# Patient Record
Sex: Male | Born: 1990 | Race: Black or African American | Hispanic: No | Marital: Single | Smoking: Current every day smoker
Health system: Southern US, Community
[De-identification: ages and names within clinical notes are randomized; demographics above are authoritative.]

---
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
## 2011-10-09 ENCOUNTER — Emergency Department (INDEPENDENT_AMBULATORY_CARE_PROVIDER_SITE_OTHER)
Admission: EM | Admit: 2011-10-09 | Discharge: 2011-10-09 | Disposition: A | Discharge status: Home / Self Care | Payer: Federal, State, Local not specified - PPO | Source: Home / Self Care | Attending: Emergency Medicine | Admitting: Emergency Medicine

## 2011-10-09 ENCOUNTER — Encounter (HOSPITAL_COMMUNITY): Payer: Self-pay

## 2011-10-09 DIAGNOSIS — Z202 Contact with and (suspected) exposure to infections with a predominantly sexual mode of transmission: Secondary | ICD-10-CM

## 2011-10-09 MED ORDER — AZITHROMYCIN 250 MG PO TABS
ORAL_TABLET | ORAL | Status: AC
  Filled 2011-10-09: qty 4

## 2011-10-09 MED ORDER — AZITHROMYCIN 250 MG PO TABS
1000.0000 mg | ORAL_TABLET | Freq: Once | ORAL | Status: AC
  Administered 2011-10-09: 1000 mg via ORAL

## 2011-10-09 NOTE — ED Notes (Signed)
Please call pt at [phone]; number listed in snapshot at time of d/c in not correct

## 2011-10-09 NOTE — ED Notes (Signed)
Was reportedly informed by his singular partner, that he needs treatment for chlamydia; denies ANY symptoms

## 2011-10-09 NOTE — Discharge Instructions (Signed)
We will; contact you if any abnormal test results- °

## 2011-10-09 NOTE — ED Provider Notes (Signed)
History     CSN: [id number]  Arrival date & time 10/09/11  1202   First MD Initiated Contact with Patient 10/09/11 1305      Chief Complaint  Patient presents with  . Exposure to STD    (Consider location/radiation/quality/duration/timing/severity/associated sxs/prior treatment) HPI Comments: Shouldn't presents urgent care describing that he has had sexual intercourse with a male partner that was recently diagnosed with Chlamydia. He wants to be treated and checked he denies having any symptoms.  Patient is a 21 y.o. male presenting with STD exposure. The history is provided by the patient.  Exposure to STD This is a new problem. Pertinent negatives include no abdominal pain.    History reviewed. No pertinent past medical history.  History reviewed. No pertinent past surgical history.  History reviewed. No pertinent family history.  History  Substance Use Topics  . Smoking status: Not on file  . Smokeless tobacco: Not on file  . Alcohol Use: No      Review of Systems  Constitutional: Negative for activity change.  HENT: Negative for neck pain.   Eyes: Negative for itching.  Gastrointestinal: Negative for abdominal pain.  Genitourinary: Negative for dysuria, frequency, penile swelling and penile pain.    Allergies  Review of patient's allergies indicates no known allergies.  Home Medications  No current outpatient prescriptions on file.  BP 156/76  Pulse 51  Temp(Src) 98.6 F (37 C) (Oral)  Resp 16  SpO2 100%  Physical Exam  Nursing note and vitals reviewed. Constitutional: He appears well-developed and well-nourished. No distress.  Genitourinary: Penis normal. Guaiac negative stool. No phimosis, paraphimosis, penile erythema or penile tenderness. No discharge found.  Skin: Skin is warm. No erythema.    ED Course  Procedures (including critical care time)   Labs Reviewed  GC/CHLAMYDIA PROBE AMP, GENITAL   No results found.   1. Chlamydia  contact       MDM  Chlamydia exposure        Jimmie Molly, MD 10/09/11 1344

## 2011-10-10 LAB — GC/CHLAMYDIA PROBE AMP, GENITAL: GC Probe Amp, Genital: NEGATIVE

## 2014-02-05 ENCOUNTER — Emergency Department (HOSPITAL_COMMUNITY)
Admission: EM | Admit: 2014-02-05 | Discharge: 2014-02-05 | Disposition: A | Discharge status: Home / Self Care | Payer: Federal, State, Local not specified - PPO | Attending: Emergency Medicine | Admitting: Emergency Medicine

## 2014-02-05 ENCOUNTER — Encounter (HOSPITAL_COMMUNITY): Payer: Self-pay | Admitting: Emergency Medicine

## 2014-02-05 DIAGNOSIS — R599 Enlarged lymph nodes, unspecified: Secondary | ICD-10-CM | POA: Insufficient documentation

## 2014-02-05 DIAGNOSIS — J029 Acute pharyngitis, unspecified: Secondary | ICD-10-CM | POA: Insufficient documentation

## 2014-02-05 DIAGNOSIS — R197 Diarrhea, unspecified: Secondary | ICD-10-CM | POA: Insufficient documentation

## 2014-02-05 LAB — RAPID STREP SCREEN (MED CTR MEBANE ONLY): Streptococcus, Group A Screen (Direct): NEGATIVE

## 2014-02-05 MED ORDER — ACETAMINOPHEN 500 MG PO TABS: 500.0000 mg | ORAL_TABLET | Freq: Four times a day (QID) | ORAL | Status: DC | PRN

## 2014-02-05 MED ORDER — ACETAMINOPHEN 325 MG PO TABS
650.0000 mg | ORAL_TABLET | Freq: Four times a day (QID) | ORAL | Status: DC | PRN
  Administered 2014-02-05: 650 mg via ORAL
  Filled 2014-02-05: qty 2

## 2014-02-05 MED ORDER — KETOROLAC TROMETHAMINE 30 MG/ML IJ SOLN
60.0000 mg | Freq: Once | INTRAMUSCULAR | Status: AC
  Administered 2014-02-05: 60 mg via INTRAMUSCULAR
  Filled 2014-02-05: qty 2

## 2014-02-05 MED ORDER — OXYCODONE-ACETAMINOPHEN 5-325 MG PO TABS: 1.0000 | ORAL_TABLET | Freq: Four times a day (QID) | ORAL | Status: DC | PRN

## 2014-02-05 MED ORDER — IBUPROFEN 600 MG PO TABS: 600.0000 mg | ORAL_TABLET | Freq: Four times a day (QID) | ORAL | Status: DC | PRN

## 2014-02-05 MED ORDER — DEXAMETHASONE SODIUM PHOSPHATE 10 MG/ML IJ SOLN
10.0000 mg | Freq: Once | INTRAMUSCULAR | Status: AC
  Administered 2014-02-05: 10 mg via INTRAMUSCULAR
  Filled 2014-02-05: qty 1

## 2014-02-05 MED ORDER — PREDNISONE 20 MG PO TABS: ORAL_TABLET | ORAL | Status: DC

## 2014-02-05 MED ORDER — MAGIC MOUTHWASH W/LIDOCAINE: 5.0000 mL | Freq: Three times a day (TID) | ORAL | Status: DC

## 2014-02-05 NOTE — ED Notes (Signed)
Pt reports for the past 2 days that he has had a sore throat, fever, and chills.

## 2014-02-05 NOTE — Discharge Instructions (Signed)
Continue to stay well-hydrated. Gargle warm salt water and spit it out. Continue to alternate between Tylenol and Ibuprofen for pain or fever. Use prednisone as directed for your throat swelling. Use magic mouthwash as directed for throat pain. Followup with your primary care doctor in 5-7 days for recheck of ongoing symptoms or return to emergency department for emergent changing or worsening of symptoms.    Pharyngitis Pharyngitis is redness, pain, and swelling (inflammation) of your pharynx.  CAUSES  Pharyngitis is usually caused by infection. Most of the time, these infections are from viruses (viral) and are part of a cold. However, sometimes pharyngitis is caused by bacteria (bacterial). Pharyngitis can also be caused by allergies. Viral pharyngitis may be spread from person to person by coughing, sneezing, and personal items or utensils (cups, forks, spoons, toothbrushes). Bacterial pharyngitis may be spread from person to person by more intimate contact, such as kissing.  SIGNS AND SYMPTOMS  Symptoms of pharyngitis include:   Sore throat.   Tiredness (fatigue).   Low-grade fever.   Headache.  Joint pain and muscle aches.  Skin rashes.  Swollen lymph nodes.  Plaque-like film on throat or tonsils (often seen with bacterial pharyngitis). DIAGNOSIS  Your health care provider will ask you questions about your illness and your symptoms. Your medical history, along with a physical exam, is often all that is needed to diagnose pharyngitis. Sometimes, a rapid strep test is done. Other lab tests may also be done, depending on the suspected cause.  TREATMENT  Viral pharyngitis will usually get better in 3-4 days without the use of medicine. Bacterial pharyngitis is treated with medicines that kill germs (antibiotics).  HOME CARE INSTRUCTIONS   Drink enough water and fluids to keep your urine clear or pale yellow.   Only take over-the-counter or prescription medicines as directed by  your health care provider:   If you are prescribed antibiotics, make sure you finish them even if you start to feel better.   Do not take aspirin.   Get lots of rest.   Gargle with 8 oz of salt water ( tsp of salt per 1 qt of water) as often as every 1-2 hours to soothe your throat.   Throat lozenges (if you are not at risk for choking) or sprays may be used to soothe your throat. SEEK MEDICAL CARE IF:   You have large, tender lumps in your neck.  You have a rash.  You cough up green, yellow-[place], or bloody spit. SEEK IMMEDIATE MEDICAL CARE IF:   Your neck becomes stiff.  You drool or are unable to swallow liquids.  You vomit or are unable to keep medicines or liquids down.  You have severe pain that does not go away with the use of recommended medicines.  You have trouble breathing (not caused by a stuffy nose). MAKE SURE YOU:   Understand these instructions.  Will watch your condition.  Will get help right away if you are not doing well or get worse. Document Released: 07/06/2005 Document Revised: 04/26/2013 Document Reviewed: 03/13/2013 [facility] Patient Information 2015 [place], [place]. This information is not intended to replace advice given to you by your health care provider. Make sure you discuss any questions you have with your health care provider.  Salt Water Gargle This solution will help make your mouth and throat feel better. HOME CARE INSTRUCTIONS   Mix 1 teaspoon of salt in 8 ounces of warm water.  Gargle with this solution as much or often as you  need or as directed. Swish and gargle gently if you have any sores or wounds in your mouth.  Do not swallow this mixture. Document Released: 04/09/2004 Document Revised: 09/28/2011 Document Reviewed: 08/31/2008 [facility] Patient Information 2015 [place], [place]. This information is not intended to replace advice given to you by your health care provider. Make sure you discuss any questions you have with  your health care provider.  Emergency Department Resource Guide 1) Find a Doctor and Pay Out of Pocket Although you won't have to find out who is covered by your insurance plan, it is a good idea to ask around and get recommendations. You will then need to call the office and see if the doctor you have chosen will accept you as a new patient and what types of options they offer for patients who are self-pay. Some doctors offer discounts or will set up payment plans for their patients who do not have insurance, but you will need to ask so you aren't surprised when you get to your appointment.  2) Contact Your Local Health Department Not all health departments have doctors that can see patients for sick visits, but many do, so it is worth a call to see if yours does. If you don't know where your local health department is, you can check in your phone book. The CDC also has a tool to help you locate your state's health department, and many state websites also have listings of all of their local health departments.  3) Find a Walk-in Clinic If your illness is not likely to be very severe or complicated, you may want to try a walk in clinic. These are popping up all over the country in pharmacies, drugstores, and shopping centers. They're usually staffed by nurse practitioners or physician assistants that have been trained to treat common illnesses and complaints. They're usually fairly quick and inexpensive. However, if you have serious medical issues or chronic medical problems, these are probably not your best option.  No Primary Care Doctor: - Call Health Connect at  [phone] - they can help you locate a primary care doctor that  accepts your insurance, provides certain services, etc. - Physician Referral Service- [phone]  Chronic Pain Problems: Organization         Address  Phone   Notes  [facility]  [phone] Patients need to be referred by their primary care  doctor.   Medication Assistance: Organization         Address  Phone   Notes  [facility] [address] [place], [place] [id number] [phone] --Must be a resident of [place] [facility] [facility] [place] -- Must have NO insurance coverage whatsoever (no Medicaid/ Medicare, etc.) -- The pt. MUST have a primary care doctor that directs their care regularly and follows them in the community   MedAssist  [phone]   [employer]  [phone]    Agencies that provide inexpensive medical care: Organization         Address  Phone   Notes  [facility]  [phone]   [facility]    [phone]   [facility] [address] [place], [place] [address] [phone]   [facility] [address], [place] [phone]   [facility]anned Parenthood    [phone]   [facility]    [phone]   [facility]  [address], [place] Phone:  [phone], Fax:  [phone] Hours of Operation:  9 am - 6 pm, M-F.  Also accepts Medicaid/Medicare and self-pay.  [facility]  [address], [place] Phone: [phone], Fax: [phone]. Hours of Operation:  8:30 am - 5:30 pm, M-F.  Also accepts Medicaid and self-pay.  [facility] [place] [address], [place] Phone: [phone]   [facility] [address] Natasha Bence [place], [place] [phone], Ext. [phone] Mondays & Thursdays: 7-9 AM.  First 15 patients are seen on a first come, first serve basis.    Medicaid-accepting [facility] Providers:  Organization         Address  Phone   Notes  [facility] [address], [place] [phone] Also accepts self-pay patients.  [facility] [place] [address] Laurell Josephs [place], [place]  [phone]   [facility] [address], [place] [phone]   [facility] [address] [place] [phone]   Renaye Rakers [address], [place]   [phone] Only accepts [place] Access [place] patients after they have their name applied to their card.   Self-Pay (no insurance) in [facility]:  Organization         Address  Phone   Notes  Sickle Cell Patients, [facility] [address] [place], [place] [phone]   [facility] [address] [place], [place] [phone]   [facility]  [address], [place] [phone]   [facility]/Dr. Osei-Bonsu  [address] [place], [place] or [address], [place] [phone] Phone number for both [place] and [place] locations is the same.  [facility]gent Medical and [facility] [address] [place] [phone]   [facility] [address], [place] or [address] [phone]   [facility] [address], [place] [phone], phone; [phone], fax Sees patients 1st and 3rd Saturday of every month.  Must not qualify for public or private insurance (i.e. Medicaid, Medicare, [place] Health Choice, Veterans' Benefits)  Household income should be no more than 200% of the poverty level The clinic cannot treat you if you are pregnant or think you are pregnant  Sexually transmitted diseases are not treated at the clinic.    Dental Care: Organization         Address  Phone  Notes  [facility] [address] [place], [place] [phone] Accepts children up to age 17 who are enrolled in [place] or [place] Health Choice; pregnant women with a Medicaid card; and children who have applied for Medicaid or [place] Health Choice, but were declined, whose parents can pay a reduced fee at time of  service.  [facility] Department of [facility]  [address], [place] [phone] Accepts children up to age 5 who are enrolled in [place] or [place] Health Choice; pregnant women with a Medicaid card; and children who have applied for Medicaid or [place] Health Choice, but were declined, whose parents can pay a reduced fee at time of service.  [facility] Adult Dental Access PROGRAM  [address] [place], [place] [phone]  Patients are seen by appointment only. Walk-ins are not accepted. [facility] will see patients 25 years of age and older. Monday - Tuesday (8am-5pm) Most Wednesdays (8:30-5pm) $30 per visit, cash only  [facility] Adult Dental Access PROGRAM  [address], [facility] [phone] Patients are seen by appointment only. Walk-ins are not accepted. [facility] will see patients 2 years of age and older. One Wednesday Evening (Monthly: Volunteer Based).  $30 per visit, cash only  [employer]  [phone] for adults; Children under age 44, call Graduate Pediatric Dentistry at [phone]. Children aged 69-14, please call [phone] to request a pediatric application.  Dental services are provided in all areas of dental care including fillings, crowns and bridges, complete and partial dentures, implants, gum treatment, root canals, and extractions. Preventive care is also provided. Treatment is provided to both adults and children. Patients are selected via a lottery and there is often a waiting list.   [facility] [address], [place]  [phone] [email or web address]   [facility] [address] [place], [place] [phone], Ext. [phone] Second and Fourth Thursday of each month, opens at 6:30 AM; Clinic ends at 9 AM.  Patients are seen on a first-come first-served basis, and a limited number are seen during each clinic.   [facility]  [address] Ether Griffins [place],  [place] [phone]   Eligibility Requirements You must have lived in [place], [place], or [place] counties for at least the last three months.   You cannot be eligible for state or federal sponsored [place], including [employer], [place], or [employer].   You generally cannot be eligible for healthcare insurance through your employer.    How to apply: Eligibility screenings are held every Tuesday and Wednesday afternoon from 1:00 pm until 4:00 pm. You do not need an appointment for the interview!  [facility] [address] Kaliel Bolds, [place], [place] [phone]   [facility]  [phone]   [facility]  [phone]   [facility] Health Department  [phone]    Behavioral Health Resources in the Community: Intensive Outpatient Programs Organization         Address  Phone  Notes  [facility] [address] [place], [place] [phone]   [facility] Outpatient [address] [place], [place] [phone]   [facility] Drug S[facility] [address], [place], [place]  [phone]   [facility] Mental Health [address],  [place], [place] [phone] or [phone]   Substance Abuse Resources Organization         Address  Phone  Notes  Alcohol and Drug Services  [phone]   [facility]  [phone]   [facility]  [phone]   Floydene Flock  [phone]   Residential & Outpatient Substance Abuse Program  [phone]   Psychological Services Organization         Address  Phone  Notes  [facility]  [phone]   [facility]  [phone]   [facility] [address], [place] [phone] or [phone]    Mobile Crisis Teams Organization         Address  Phone  Notes  [facility], Mobile Crisis Care Unit  [phone]   [facility]  [address] [place], [place] [phone]   [facility] [address] [place]  [place] [phone]    Self-Help/Support Groups Organization         Address  Phone             Notes  [facility] [place] - variety of support groups  [place] Call for more information  Narcotics Anonymous (NA), [facility] [address], [employer] [place]  2 meetings at this location   Special educational needs teacher         Address  Phone  Notes  [facility] [place],    [place]  [phone]   [facility]  [address] [place] [id number], [place], [place]   [place], [place] [phone] Admissions: 8am-3pm M-F  Incentives Substance [place] [address]    [place], [place] [phone]   [facility] [address] [place], [place], [place]   [facility] [address]  [place], [place]   [facility] - Intensive Outpatient [address], [place], [place]   [facility] [address]  [place], [place] [phone] or [phone]   [facility] (RT[facility]) [address], [place], [place] Accepts Medicaid  [facility] [address],  [place] [phone] Substance Abuse/Addiction Treatment   [facility] Organization         Address  Phone  Notes  [facility]  [phone]   Domenic Schwab, PhD [address] Arlis Porta [place], [place]   [phone] or [phone]   [facility] [place], [place] [phone]   [facility] [address], [place], [place] [phone] Insurance/Medicaid/sponsorship through [facility] and Families [address]                                    [place], [place] [phone]  [place] [address], [place] [phone]    Dr. Adele Schilder  [phone]   [facility] Dept. 1) [address] [place] 2) [place] 3)  [address], [place] [phone]  [phone]   [place] [phone] or [phone] (After Hours)

## 2014-02-05 NOTE — ED Provider Notes (Signed)
CSN: [id number]     Arrival date & time 02/05/14  4098 History  This chart was scribed for non-physician practitioner, Allen Derry, PA-C working with Dagmar Hait, MD by Luisa Dago, ED scribe. This patient was seen in room TR09C/TR09C and the patient's care was started at 7:39 PM.    Chief Complaint  Patient presents with  . Sore Throat   Patient is a 23 y.o. male presenting with pharyngitis. The history is provided by the patient. No language interpreter was used.  Sore Throat This is a new problem. The current episode started 2 days ago. The problem occurs constantly. The problem has been gradually worsening. Pertinent negatives include no chest pain, no abdominal pain, no headaches and no shortness of breath. The symptoms are aggravated by swallowing. Nothing relieves the symptoms. Treatments tried: nyquil. The treatment provided no relief.   HPI Comments: DEMTRIUS Cameron is a 23 y.o. male with no PMHx who presents to the Emergency Department complaining of worsening sore throat that started approximately 2 days ago. He is also complaining of associated fever, diarrhea, chills. Pt rates his current pain as a "10/10", in his throat, non-radiating, constant, worse with swallowing, and with no known alleviating factors. Reports taking Nyquil with no relief. He states that he is having difficulty swallowing secondary to the pain, and has a muffled voice. 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 Denies any sick contacts, medicinal allergies, environmental allergies, HA, abd pain, nausea, emesis, congestion, cough, leg swelling, chest pain, SOB, ear pain, history of sore throat, or pertinent medical history.  History reviewed. No pertinent past medical history. History reviewed. No pertinent past surgical history. History reviewed. No pertinent family history. History  Substance Use Topics  .  Smoking status: Never Smoker   . Smokeless tobacco: Not on file  . Alcohol Use: Yes    Review of Systems  Constitutional: Positive for fever and chills.  HENT: Positive for sore throat and voice change (muffled). Negative for congestion, dental problem, drooling, ear discharge, ear pain, facial swelling, postnasal drip, rhinorrhea, sinus pressure, tinnitus and trouble swallowing.   Eyes: Negative for pain and redness.  Respiratory: Negative for cough and shortness of breath.   Cardiovascular: Negative for chest pain.  Gastrointestinal: Positive for diarrhea. Negative for nausea, vomiting, abdominal pain and blood in stool.  Musculoskeletal: Negative for arthralgias, joint swelling and myalgias.  Skin: Negative for rash.  Neurological: Negative for weakness and headaches.      Allergies  Review of patient's allergies indicates no known allergies.  Home Medications   Prior to Admission medications   Medication Sig Start Date End Date Taking? Authorizing Provider  Pseudoeph-Doxylamine-DM-APAP (NYQUIL PO) Take 30 mLs by mouth daily as needed (for cough/congestion).   Yes Historical Provider, MD  acetaminophen (TYLENOL) 500 MG tablet Take 1 tablet (500 mg total) by mouth every 6 (six) hours as needed for mild pain or fever. 02/05/14   Rozina Pointer Strupp Camprubi-Soms, PA-C  Alum & Mag Hydroxide-Simeth (MAGIC MOUTHWASH W/LIDOCAINE) SOLN Take 5 mLs by mouth 3 (three) times daily. Swish and swallow 02/05/14   Eavan Gonterman Strupp Camprubi-[employer]oms, PA-C  ibuprofen (ADVIL,MOTRIN) 600 MG tablet Take 1 tablet (600 mg total) by mouth every 6 (six) hours as needed for fever or mild pain. 02/05/14   Keni Elison Strupp Camprubi-Soms, PA-C  oxyCODONE-acetaminophen (PERCOCET) 5-325 MG per tablet Take 1-2 tablets by mouth every 6 (six) hours as needed  for severe pain. 02/05/14   Clodagh Odenthal Strupp Camprubi-Soms, PA-C  predniSONE (DELTASONE) 20 MG tablet 3 tabs po daily x 4 days 02/05/14   Karas Pickerill Strupp Camprubi-[employer]oms, PA-C   BP 148/71  Pulse 93  Temp(Src) 102.9 F (39.4 C) (Axillary)  Resp 18  Ht 5\' 10"  (1.778 m)  Wt 165 lb (74.844 kg)  BMI 23.68 kg/m2  SpO2 99%   Physical Exam  Nursing note and vitals reviewed. Constitutional: He is oriented to person, place, and time. He appears well-developed and well-nourished.  Non-toxic appearance. No distress.  Febrile, nontoxic appearing  HENT:  Head: Normocephalic and atraumatic.  Right Ear: Hearing, tympanic membrane, external ear and ear canal normal. No drainage or swelling. No mastoid tenderness. Tympanic membrane is not injected.  Left Ear: Hearing, tympanic membrane, external ear and ear canal normal. No drainage or swelling. No mastoid tenderness. Tympanic membrane is not injected.  Nose: Nose normal. No mucosal edema or rhinorrhea.  Mouth/Throat: Uvula is midline and mucous membranes are normal. No trismus in the jaw. No uvula swelling. Oropharyngeal exudate, posterior oropharyngeal edema and posterior oropharyngeal erythema present. No tonsillar abscesses.  Dixon/AT, bilateral ears free of drainage or erythema. Nose clear with no rhinorrhea or edema. 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 Using a tongue depressor, I was able to visualize both tonsils which are covered with a white exudate, with 4+ swelling on both sides equally, and erythematous. Swab was obtained. There is no peritonsillar abscess visualized. No trismus. Uvula is midline with no swelling. Mucous membranes are moist  Eyes: Conjunctivae and EOM are normal. Pupils are equal, round, and reactive to light.  Neck: Normal range of motion. Neck supple.  Cardiovascular: Normal rate and intact distal pulses.   Pulmonary/Chest: Effort normal and breath sounds normal. No respiratory distress. He has no decreased breath sounds. He has no wheezes. He has no rhonchi. He has no rales.  Abdominal: Normal appearance. He  exhibits no distension.  Musculoskeletal: Normal range of motion.  Lymphadenopathy:       Head (right side): Tonsillar adenopathy present. No submental, no submandibular and no occipital adenopathy present.       Head (left side): Tonsillar adenopathy present. No submental, no submandibular and no occipital adenopathy present.    He has cervical adenopathy.       Right cervical: Superficial cervical and deep cervical adenopathy present. No posterior cervical adenopathy present.      Left cervical: Superficial cervical and deep cervical adenopathy present. No posterior cervical adenopathy present.  Tonsillar and superficial and deep cervical lymphadenopathy, nontender to palpation.  Neurological: He is alert and oriented to person, place, and time.  Skin: Skin is warm, dry and intact. No rash noted.  No rash  Psychiatric: He has a normal mood and affect. His behavior is normal.    ED Course  Procedures (including critical care time)  DIAGNOSTIC STUDIES: Oxygen Saturation is 99% on RA, normal by my interpretation.    COORDINATION OF CARE: 7:44 PM- Advised pt to alternate between tylenol and Ibuprofen for his fever. Will prescribe a short course of Prednisone. Pt advised of plan for treatment and pt agrees.  Labs Review Labs Reviewed  RAPID STREP SCREEN  CULTURE, GROUP A STREP    Imaging Review No results found.   EKG Interpretation None      MDM   Final diagnoses:  Pharyngitis    Forest GleasonJared R Sherburne is  a 23 y.o. male presenting with sore throat x2 days along with fever. Upon exam, tonsils are erythematous, edematous, and covered with exudate. Original fever was obtained orally but pt was sipping on soda, therefore axillary temp was obtained and was 102.9. 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 There is no trismus, no drooling, no  evidence of a peritonsillar abscess. Tylenol and Toradol were given in the ER to help decrease his fever, as well as a Decadron shot for swelling. Awaiting re-swab results. Do not feel pt needs imaging at this time, will monitor.  9:03 PM Re-swab was negative for strep. Will wait for culture, discussed with the patient that if the culture returns positive then he will be called and given a medication to take. At this point, his pain has improved, and his phonation has improved immensely after decadron shot. His tonsils are less swollen at reexamination, again there is no peritonsillar abscess noted. I feel comfortable discharging this patient with strict return precautions. Have given him prednisone for 4 days for continued swelling relief, Tylenol and ibuprofen for pain and fever, Magic mouthwash to help with pain, as well as a small supply of Percocet for severe pain. Discussed salt water gargles. Given strict return precautions. Pt stable for d/c, and fever decreased to 100.4 prior to discharge.   I personally performed the services described in this documentation, which was scribed in my presence. The recorded information has been reviewed and is accurate.  BP 95/44  Pulse 80  Temp(Src) 100.4 F (38 C) (Oral)  Resp 18  Ht 5\' 10"  (1.778 m)  Wt 165 lb (74.844 kg)  BMI 23.68 kg/m2  SpO2 100%    [employer], PA-C 02/06/14 0345

## 2014-02-06 NOTE — ED Provider Notes (Signed)
Medical screening examination/treatment/procedure(s) were performed by non-physician practitioner and as supervising physician I was immediately available for consultation/collaboration.   EKG Interpretation None        William Trejuan Matherne, MD 02/06/14 2246 

## 2014-02-07 LAB — CULTURE, GROUP A STREP

## 2014-02-24 ENCOUNTER — Encounter (HOSPITAL_COMMUNITY): Payer: Self-pay | Admitting: Emergency Medicine

## 2014-02-24 ENCOUNTER — Emergency Department (HOSPITAL_COMMUNITY)
Admission: EM | Admit: 2014-02-24 | Discharge: 2014-02-24 | Disposition: A | Discharge status: Home / Self Care | Payer: Federal, State, Local not specified - PPO | Attending: Emergency Medicine | Admitting: Emergency Medicine

## 2014-02-24 DIAGNOSIS — R220 Localized swelling, mass and lump, head: Secondary | ICD-10-CM

## 2014-02-24 DIAGNOSIS — Z9189 Other specified personal risk factors, not elsewhere classified: Secondary | ICD-10-CM

## 2014-02-24 DIAGNOSIS — K006 Disturbances in tooth eruption: Secondary | ICD-10-CM | POA: Insufficient documentation

## 2014-02-24 DIAGNOSIS — R221 Localized swelling, mass and lump, neck: Principal | ICD-10-CM

## 2014-02-24 MED ORDER — CLINDAMYCIN HCL 150 MG PO CAPS: 150.0000 mg | ORAL_CAPSULE | Freq: Four times a day (QID) | ORAL | Status: DC

## 2014-02-24 NOTE — ED Provider Notes (Signed)
CSN: [id number]     Arrival date & time 02/24/14  1428 History  This chart was scribed for non-physician practitioner working with Purvis Sheffield, MD by Modena Jansky, ED Scribe. This patient was seen in room TR05C/TR05C and the patient's care was started at 3:13 PM.  Chief Complaint  Patient presents with  . Oral Swelling   The history is provided by the patient. No language interpreter was used.   HPI Comments: Thomas Cameron is a 23 y.o. male who presents to the Emergency Department complaining of oral swelling that started two days ago. He states that he woke up in the morning with oral swelling. pain is waxing and waning, aching, 4/10.  He denies any pain or dental problems. He reports no known allergies to medication. He also denies any fever, nausea, emesis, or diarrhea.  Denies difficulty breathing or swallowing.  History reviewed. No pertinent past medical history. History reviewed. No pertinent past surgical history. History reviewed. No pertinent family history. History  Substance Use Topics  . Smoking status: Never Smoker   . Smokeless tobacco: Not on file  . Alcohol Use: Yes     Comment: occasional    Review of Systems  Constitutional: Negative for fever.  HENT: Positive for facial swelling.   Gastrointestinal: Negative for nausea, vomiting and diarrhea.  All other systems reviewed and are negative.   Allergies  Review of patient's allergies indicates no known allergies.  Home Medications   Prior to Admission medications   Medication Sig Start Date End Date Taking? Authorizing Provider  ibuprofen (ADVIL,MOTRIN) 600 MG tablet Take 600 mg by mouth every 6 (six) hours as needed for mild pain.   Yes Historical Provider, MD  clindamycin (CLEOCIN) 150 MG capsule Take 1 capsule (150 mg total) by mouth every 6 (six) hours. 02/24/14   Junius Finner, PA-C   BP 124/70  Pulse 76  Temp(Src) 98.6 F (37 C) (Oral)  Ht 5\' 10"  (1.778 m)  Wt 165 lb (74.844 kg)  BMI 23.68 kg/m2   SpO2 100% Physical Exam  Nursing note and vitals reviewed. Constitutional: He is oriented to person, place, and time. He appears well-developed and well-nourished.  HENT:  Head: Normocephalic and atraumatic.  Mild to moderate left sided facial edema.  Partially impacted left upper and lower wisdom teeth with gingival erythema. Induration without fluctuance of the buccal mucosa.   Eyes: EOM are normal.  Neck: Normal range of motion.  Cardiovascular: Normal rate.   Pulmonary/Chest: Effort normal.  Musculoskeletal: Normal range of motion.  Neurological: He is alert and oriented to person, place, and time.  Skin: Skin is warm and dry.  Psychiatric: He has a normal mood and affect. His behavior is normal.    ED Course  Procedures (including critical care time) DIAGNOSTIC STUDIES: Oxygen Saturation is 100% on RA, normal by my interpretation.    COORDINATION OF CARE: 3:17 PM- Pt advised of plan for treatment and pt agrees.  Labs Review Labs Reviewed - No data to display  Imaging Review No results found.   EKG Interpretation None      MDM   Final diagnoses:  Left facial swelling  At risk for dental problems    Pt is a 23yo male c/o left sided facial swelling with mild pain. Started 2 days ago. Denies fever, n/v/d. Pt does have partially impacted wisdom teeth on exam. Pt then states he knows he needs them taken out. No obvious gingival abscess, edema and mild tenderness to buccal mucosa.  Swelling likely due to underlying dental abscess possibly from impacted wisdom teeth. Will start pt on clindamycin and advise to call Monday, 8/10 to schedule f/u appointment with Dr. Mayford Knifeurner, DDS, for further evaluation and treatment. Return precautions provided. Pt verbalized understanding and agreement with tx plan.   I personally performed the services described in this documentation, which was scribed in my presence. The recorded information has been reviewed and is accurate.     Junius Finnerrin  O'Malley, PA-C 02/24/14 [phone]

## 2014-02-24 NOTE — ED Notes (Signed)
Pt stated he woke up with mouth pain and swollen cheek.

## 2014-02-24 NOTE — ED Notes (Signed)
Declined W/C at D/C and was escorted to lobby by RN. 

## 2014-02-25 NOTE — ED Provider Notes (Signed)
Medical screening examination/treatment/procedure(s) were performed by non-physician practitioner and as supervising physician I was immediately available for consultation/collaboration.   EKG Interpretation None        Shelanda Duvall, MD 02/25/14 1219 

## 2014-11-19 ENCOUNTER — Encounter (HOSPITAL_COMMUNITY): Payer: Self-pay | Admitting: *Deleted

## 2014-11-19 ENCOUNTER — Emergency Department (HOSPITAL_COMMUNITY)
Admission: EM | Admit: 2014-11-19 | Discharge: 2014-11-19 | Disposition: A | Discharge status: Home / Self Care | Payer: Federal, State, Local not specified - PPO | Attending: Emergency Medicine | Admitting: Emergency Medicine

## 2014-11-19 DIAGNOSIS — K006 Disturbances in tooth eruption: Secondary | ICD-10-CM | POA: Insufficient documentation

## 2014-11-19 DIAGNOSIS — G479 Sleep disorder, unspecified: Secondary | ICD-10-CM | POA: Insufficient documentation

## 2014-11-19 DIAGNOSIS — K088 Other specified disorders of teeth and supporting structures: Secondary | ICD-10-CM | POA: Insufficient documentation

## 2014-11-19 DIAGNOSIS — Z792 Long term (current) use of antibiotics: Secondary | ICD-10-CM | POA: Insufficient documentation

## 2014-11-19 DIAGNOSIS — K0889 Other specified disorders of teeth and supporting structures: Secondary | ICD-10-CM

## 2014-11-19 MED ORDER — HYDROCODONE-ACETAMINOPHEN 5-325 MG PO TABS
1.0000 | ORAL_TABLET | Freq: Four times a day (QID) | ORAL | Status: DC | PRN
Start: 2014-11-19 — End: 2015-02-14

## 2014-11-19 MED ORDER — PENICILLIN V POTASSIUM 500 MG PO TABS: 500.0000 mg | ORAL_TABLET | Freq: Four times a day (QID) | ORAL | Status: DC

## 2014-11-19 MED ORDER — PENICILLIN V POTASSIUM 250 MG PO TABS
250.0000 mg | ORAL_TABLET | Freq: Once | ORAL | Status: AC
  Administered 2014-11-19: 250 mg via ORAL
  Filled 2014-11-19: qty 1

## 2014-11-19 NOTE — Discharge Instructions (Signed)

## 2014-11-19 NOTE — ED Notes (Signed)
Declined W/C at D/C and was escorted to lobby by RN. 

## 2014-11-19 NOTE — ED Provider Notes (Signed)
CSN: [id number]     Arrival date & time 11/19/14  1252 History   First MD Initiated Contact with Patient 11/19/14 1339     Chief Complaint  Patient presents with  . Dental Problem     (Consider location/radiation/quality/duration/timing/severity/associated sxs/prior Treatment) HPI Comments: Patient presents to the emergency department with chief complaint of dental pain. Patient states that he began developing an abscess last Thursday. States the symptoms have gradually been worsening. Reports moderate to severe pain. Denies any fevers or chills. States that it is worsened when he opens his mouth. He does not have a dentist.  The history is provided by the patient. No language interpreter was used.    History reviewed. No pertinent past medical history. History reviewed. No pertinent past surgical history. No family history on file. History  Substance Use Topics  . Smoking status: Never Smoker   . Smokeless tobacco: Not on file  . Alcohol Use: Yes     Comment: occasional    Review of Systems  Constitutional: Negative for fever and chills.  HENT: Positive for dental problem. Negative for drooling.   Neurological: Negative for speech difficulty.  Psychiatric/Behavioral: Positive for sleep disturbance.      Allergies  Review of patient's allergies indicates no known allergies.  Home Medications   Prior to Admission medications   Medication Sig Start Date End Date Taking? Authorizing Provider  clindamycin (CLEOCIN) 150 MG capsule Take 1 capsule (150 mg total) by mouth every 6 (six) hours. 02/24/14   Junius FinnerErin O'Malley, PA-C  ibuprofen (ADVIL,MOTRIN) 600 MG tablet Take 600 mg by mouth every 6 (six) hours as needed for mild pain.    Historical Provider, MD   BP 123/71 mmHg  Pulse 78  Temp(Src) 98.9 F (37.2 C) (Oral)  Resp 18  SpO2 99% Physical Exam  Constitutional: He is oriented to person, place, and time. He appears well-developed and well-nourished.  HENT:  Head:  Normocephalic and atraumatic.  Poor dentition throughout.  Affected tooth as diagrammed. Moderate swelling of the left cheek, no visible/palpable abscess.  No signs of peritonsillar or tonsillar abscess.  No signs of gingival abscess. Oropharynx is clear and without exudates.  Uvula is midline.  Airway is intact. No signs of Ludwig's angina with palpation of oral and sublingual mucosa. Mild trismus   Eyes: Conjunctivae and EOM are normal.  Neck: Normal range of motion.  Cardiovascular: Normal rate.   Pulmonary/Chest: Effort normal.  Abdominal: He exhibits no distension.  Musculoskeletal: Normal range of motion.  Neurological: He is alert and oriented to person, place, and time.  Skin: Skin is dry.  Psychiatric: He has a normal mood and affect. His behavior is normal. Judgment and thought content normal.  Nursing note and vitals reviewed.   ED Course  Procedures (including critical care time) Labs Review Labs Reviewed - No data to display  Imaging Review No results found.   EKG Interpretation None      MDM   Final diagnoses:  Pain, dental    Patient with dental pain, and probable abscess. I do not see an abscess that I can drain however. Will recommend dental follow-up. Will discharge with antibiotics and pain medicine. Patient understands and agrees to plan. He is stable and ready for discharge.    Roxy Horsemanobert Ndidi Nesby, PA-C 11/19/14 1431  Mirian MoMatthew Gentry, MD 11/20/14 [phone]

## 2014-11-19 NOTE — ED Notes (Signed)
PT is here with left dental abscess to lower area with swelling.  Started Thursday

## 2015-02-14 ENCOUNTER — Emergency Department (HOSPITAL_COMMUNITY)
Admission: EM | Admit: 2015-02-14 | Discharge: 2015-02-14 | Disposition: A | Discharge status: Home / Self Care | Payer: Federal, State, Local not specified - PPO | Attending: Emergency Medicine | Admitting: Emergency Medicine

## 2015-02-14 ENCOUNTER — Encounter (HOSPITAL_COMMUNITY): Payer: Self-pay | Admitting: Nurse Practitioner

## 2015-02-14 DIAGNOSIS — Z720 Tobacco use: Secondary | ICD-10-CM | POA: Insufficient documentation

## 2015-02-14 DIAGNOSIS — L0201 Cutaneous abscess of face: Secondary | ICD-10-CM | POA: Insufficient documentation

## 2015-02-14 DIAGNOSIS — Z792 Long term (current) use of antibiotics: Secondary | ICD-10-CM | POA: Insufficient documentation

## 2015-02-14 MED ORDER — HYDROCODONE-ACETAMINOPHEN 5-325 MG PO TABS: 1.0000 | ORAL_TABLET | Freq: Four times a day (QID) | ORAL | Status: DC | PRN

## 2015-02-14 MED ORDER — LIDOCAINE HCL (PF) 1 % IJ SOLN
30.0000 mL | Freq: Once | INTRAMUSCULAR | Status: AC
  Administered 2015-02-14: 30 mL
  Filled 2015-02-14: qty 30

## 2015-02-14 MED ORDER — IBUPROFEN 800 MG PO TABS: 800.0000 mg | ORAL_TABLET | Freq: Three times a day (TID) | ORAL | Status: DC

## 2015-02-14 MED ORDER — SULFAMETHOXAZOLE-TRIMETHOPRIM 800-160 MG PO TABS: 1.0000 | ORAL_TABLET | Freq: Two times a day (BID) | ORAL | Status: DC

## 2015-02-14 NOTE — Discharge Instructions (Signed)
Abscess °An abscess is an infected area that contains a collection of pus and debris. It can occur in almost any part of the body. An abscess is also known as a furuncle or boil. °CAUSES  °An abscess occurs when tissue gets infected. This can occur from blockage of oil or sweat glands, infection of hair follicles, or a minor injury to the skin. As the body tries to fight the infection, pus collects in the area and creates pressure under the skin. This pressure causes pain. People with weakened immune systems have difficulty fighting infections and get certain abscesses more often.  °SYMPTOMS °Usually an abscess develops on the skin and becomes a painful mass that is red, warm, and tender. If the abscess forms under the skin, you may feel a moveable soft area under the skin. Some abscesses break open (rupture) on their own, but most will continue to get worse without care. The infection can spread deeper into the body and eventually into the bloodstream, causing you to feel ill.  °DIAGNOSIS  °Your caregiver will take your medical history and perform a physical exam. A sample of fluid may also be taken from the abscess to determine what is causing your infection. °TREATMENT  °Your caregiver may prescribe antibiotic medicines to fight the infection. However, taking antibiotics alone usually does not cure an abscess. Your caregiver may need to make a small cut (incision) in the abscess to drain the pus. In some cases, gauze is packed into the abscess to reduce pain and to continue draining the area. °HOME CARE INSTRUCTIONS  °· Only take over-the-counter or prescription medicines for pain, discomfort, or fever as directed by your caregiver. °· If you were prescribed antibiotics, take them as directed. Finish them even if you start to feel better. °· If gauze is used, follow your caregiver's directions for changing the gauze. °· To avoid spreading the infection: °· Keep your draining abscess covered with a  bandage. °· Wash your hands well. °· Do not share personal care items, towels, or whirlpools with others. °· Avoid skin contact with others. °· Keep your skin and clothes clean around the abscess. °· Keep all follow-up appointments as directed by your caregiver. °SEEK MEDICAL CARE IF:  °· You have increased pain, swelling, redness, fluid drainage, or bleeding. °· You have muscle aches, chills, or a general ill feeling. °· You have a fever. °MAKE SURE YOU:  °· Understand these instructions. °· Will watch your condition. °· Will get help right away if you are not doing well or get worse. °Document Released: 04/15/2005 Document Revised: 01/05/2012 Document Reviewed: 09/18/2011 °ExitCare® Patient Information ©2015 [employer]. This information is not intended to replace advice given to you by your health care provider. Make sure you discuss any questions you have with your health care provider. ° °Abscess °Care After °An abscess (also called a boil or furuncle) is an infected area that contains a collection of pus. Signs and symptoms of an abscess include pain, tenderness, redness, or hardness, or you may feel a moveable soft area under your skin. An abscess can occur anywhere in the body. The infection may spread to surrounding tissues causing cellulitis. A cut (incision) by the surgeon was made over your abscess and the pus was drained out. Gauze may have been packed into the space to provide a drain that will allow the cavity to heal from the inside outwards. The boil may be painful for 5 to 7 days. Most people with a boil do not have   high fevers. Your abscess, if seen early, may not have localized, and may not have been lanced. If not, another appointment may be required for this if it does not get better on its own or with medications. °HOME CARE INSTRUCTIONS  °· Only take over-the-counter or prescription medicines for pain, discomfort, or fever as directed by your caregiver. °· When you bathe, soak and then  remove gauze or iodoform packs at least daily or as directed by your caregiver. You may then wash the wound gently with mild soapy water. Repack with gauze or do as your caregiver directs. °SEEK IMMEDIATE MEDICAL CARE IF:  °· You develop increased pain, swelling, redness, drainage, or bleeding in the wound site. °· You develop signs of generalized infection including muscle aches, chills, fever, or a general ill feeling. °· An oral temperature above 102° F (38.9° C) develops, not controlled by medication. °See your caregiver for a recheck if you develop any of the symptoms described above. If medications (antibiotics) were prescribed, take them as directed. °Document Released: 01/22/2005 Document Revised: 09/28/2011 Document Reviewed: 09/19/2007 °ExitCare® Patient Information ©2015 [employer]. This information is not intended to replace advice given to you by your health care provider. Make sure you discuss any questions you have with your health care provider. ° °Emergency Department Resource Guide °1) Find a Doctor and Pay Out of Pocket °Although you won't have to find out who is covered by your insurance plan, it is a good idea to ask around and get recommendations. You will then need to call the office and see if the doctor you have chosen will accept you as a new patient and what types of options they offer for patients who are self-pay. Some doctors offer discounts or will set up payment plans for their patients who do not have insurance, but you will need to ask so you aren't surprised when you get to your appointment. ° °2) Contact Your Local Health Department °Not all health departments have doctors that can see patients for sick visits, but many do, so it is worth a call to see if yours does. If you don't know where your local health department is, you can check in your phone book. The CDC also has a tool to help you locate your state's health department, and many state websites also have listings of  all of their local health departments. ° °3) Find a Walk-in Clinic °If your illness is not likely to be very severe or complicated, you may want to try a walk in clinic. These are popping up all over the country in pharmacies, drugstores, and shopping centers. They're usually staffed by nurse practitioners or physician assistants that have been trained to treat common illnesses and complaints. They're usually fairly quick and inexpensive. However, if you have serious medical issues or chronic medical problems, these are probably not your best option. ° °No Primary Care Doctor: °- Call [facility] Connect at  832-8000 - they can help you locate a primary care doctor that  accepts your insurance, provides certain services, etc. °- Physician Referral Service- 1-800-533-3463 ° °Chronic Pain Problems: °Organization         Address  Phone   Notes  °[facility]  ([phone]) 297-2271 Patients need to be referred by their primary care doctor.  ° °Medication Assistance: °Organization         Address  Phone   Notes  °[place] [facility] [address] °[place], [place] [address] (  336) 641-8030 --Must be a resident of [place] °-- Must have NO insurance coverage whatsoever (no Medicaid/ Medicare, etc.) °-- The pt. MUST have a primary care doctor that directs their care regularly and follows them in the community °  °[facility]  (866) 331-1348   °[employer]  (888) 892-1162   ° °Agencies that provide inexpensive medical care: °Organization         Address  Phone   Notes  °[facility]  (336) 832-8035   °[facility]    (336) 832-7272   °[facility] [address] °[place], [place] [address] (336) 832-4777   °[facility] [address], °[place] (336) 271-4999   °[facility]    (336) 373-0678   °[facility]    (336) 272-1050   °[facility] ° [address],  [place] Phone:  (336) 832-4444, Fax:  (336) 832-4440 Hours of Operation:  9 am - 6 pm, M-F.  Also accepts Medicaid/Medicare and self-pay.  °[facility] ° [address], [place] Phone: (336) 832-3150, Fax: (336) [phone]. Hours of Operation:  8:30 am - 5:30 pm, M-F.  Also accepts Medicaid and self-pay.  °[facility] [place] [address], [place] Phone: (336) 878-6027   °[facility] [address], [place], [place] (336)723-1848, Ext. 123 Mondays & Thursdays: 7-9 AM.  First 15 patients are seen on a first come, first serve basis. °  ° °Medicaid-accepting [place] Providers: ° °Organization         Address  Phone   Notes  °[facility] [address], [place] (336) 641-2100 Also accepts self-pay patients.  °[facility] [address], [place] ° (336) 856-9996   °[facility] [address], [place] (336) 288-8857   °[facility] [address] [place] [address], [place] (336) 299-7000   °Veita Bland [address], [place]  ° (336) 373-1557 Only accepts [place] Access Medicaid patients after they have their name applied to their card.  ° °Self-Pay (no insurance) in [place]: ° °Organization         Address  Phone   Notes  °Sickle Cell Patients, [facility] [address], [place] (336) 832-1970   °[facility] [address], [place] (336) 832-4400   °[facility] ° [address], [place] (336) 992-4800   °[facility]/Dr. Osei-Bonsu ° [address] [place] [address], [place] or [address], [place] (336) [phone] Phone number for both [place] and [place] locations is the same.  °[facility] [address], [place] (336) 299-0000   °[facility] [address] [place] [address], [place] or [address] (336) 852-7530 °(336) 878-2260   °[facility] [address], [place] (336) 350-1642, phone; (336) 294-5005, fax Sees patients 1st and 3rd Saturday of every month.  Must not qualify for public or private insurance (i.e. Medicaid, Medicare, [place] Health Choice, Veterans' Benefits) • Household income should be no more than 200% of the poverty level •The clinic cannot treat you if you are pregnant or think you are pregnant • Sexually transmitted diseases are not treated at the clinic.  ° ° °Dental Care: °Organization           Address  Phone  Notes  °[place] [facility] [address], [place] (336) 641-6152 Accepts children up to age 21 who are enrolled in Medicaid or [place] Health Choice; pregnant women with a Medicaid card; and children who have applied for Medicaid or [place] Health Choice, but were declined, whose parents can pay a reduced fee at time of service.  °[place] [place]  [address], [place] (336) 641-7733 Accepts children up to age 21 who are enrolled in Medicaid or [place] Health Choice; pregnant women with a Medicaid card; and children who have applied for Medicaid or [place] Health Choice, but were declined, whose parents can pay a reduced fee at time of service.  °[facility] ° [address], [place] (336) 641-4533 Patients are seen by appointment only. Walk-ins are not accepted. [facility] will see patients 18 years of age and older. °Monday - Tuesday (8am-5pm) °Most Wednesdays (8:30-5pm) °$30 per visit, cash only  °[facility] ° [address], [place] (336) 641-4533 Patients are seen by appointment only. Walk-ins are not accepted. [facility] will see patients 18 years of age and older. °One Wednesday Evening (Monthly: Volunteer Based).  $30 per visit, cash only  °[facility]   (919) 537-3737 for adults; Children under age 4, call Graduate Pediatric Dentistry at ([phone]) [phone]. Children aged 4-14, please call ([phone]) [phone] to request a pediatric application. ° Dental services are provided in all areas of dental care including fillings, crowns and bridges, complete and partial dentures, implants, gum treatment, root canals, and extractions. Preventive care is also provided. Treatment is provided to both adults and children. °Patients are selected via a lottery and there is often a waiting list. °  °[facility] [address], °[place] ° (336) 763-8833 [email or web address] °  °[facility] [address], [place], [place] (336)723-1848, Ext. 123 Second and Fourth Thursday of each month, opens at 6:30 AM; Clinic ends at 9 AM.  Patients are seen on a first-come first-served basis, and a limited number are seen during each clinic.  ° °[facility] ° [address], [place], [place] (336) 723-7904   Eligibility Requirements °You must have lived in [place], [place], or [place] counties for at least the last three months. °  You cannot be eligible for state or federal sponsored healthcare insurance, including Veterans Administration, Medicaid, or Medicare. °  You generally cannot be eligible for healthcare insurance through your employer.  °  How to apply: °Eligibility screenings are held every Tuesday and Wednesday afternoon from 1:00 pm until 4:00 pm. You do not need an appointment for the interview!  °[facility] [address], [place], [place] 336-631-2330   °[facility]  336-342-8273   °[facility]  336-703-3100   °[facility]  336-570-6415   ° °Behavioral Health Resources in the Community: °Intensive Outpatient Programs °Organization         Address  Phone  Notes  °[facility] [address], [place], [place] 336-878-6098   °[facility] [address], [place], [place] 336-832-9800   °[facility] [address], [place], [place] ° 336-882-2125   °[facility] [address],  °[place],  1-800-853-5163 or 336-641-4981   °Substance Abuse Resources °  Organization         Address  Phone  Notes  [facility]  757-185-5938   [facility]  (670)028-1535   [facility]  5744152814   Floydene Flock  (309)715-0765   Residential & Outpatient Substance Abuse Program  817-809-8769   Psychological Services Organization         Address  Phone  Notes  [facility]  336712 135 9941   [facility]  260-612-6791   [facility] [address] [place] 778-795-5321 or (314) 443-3076    Mobile Crisis Teams Organization         Address  Phone  Notes  [facility], Mobile Crisis Care Unit  (506)560-9601   [facility]  [address] [place], [place] 315-176-1607   Doristine Locks [address] [place] 371-062-6948    Self-Help/Support Groups Organization         Address  Phone             Notes  [facility] [place] - variety of support groups  336- I7437963 Call for more information  Narcotics Anonymous (NA), [facility] [address], [employer] [place]  2 meetings at this location   Statistician         Address  Phone  Notes  [facility] [address],    [place]  5-462-703-5009   [facility]  [address] [place] [address], [place], [place] 937-169-6789   [facility] [address] [place], [place] [phone] Admissions: 8am-3pm M-F  [facility] [address]    [place], [place] 585-277-8242   [facility] [address] [place], [place], [place] 353-614-4315   [facility] [address]  [place], [place] [phone]    [facility] Intensive Outpatient [address], [place], [place] [phone]   [facility] [address] [place]  [place], [place] 8-099-833-8250 or 925-160-3163   [facility] ([facility] [address] [place], [place] [phone] Accepts Medicaid  [facility] [address],  [place] 5-329-924-2683 Substance Abuse/Addiction Treatment   [facility] Organization         Address  Phone  Notes  [facility]  757-088-2057   Angie Fava, PhD [address] [place], [place]   8603330526 or 574-686-9045   [facility]   [address] [place], [place] (971)788-5085   [facility] [address], [place], [place] (878)[phone] Insurance/Medicaid/sponsorship through [facility] [address]                                    [place], [place] [phone] Therapy/tele-psych/case  [facility] [address], [place] [phone]    Dr. Lolly Mustache  ([phone]   [facility]  [employer] [facility] Dept. 1) [address] [place] 2) [address], [place] 3)  [address], [place] [phone]  [phone]   [facility] Child Abuse Hotline 608-304-0616 or 714-698-0988 (After Hours)

## 2015-02-14 NOTE — ED Notes (Signed)
He has a painful, swollen abscess to L cheek x 1 week. Denies dental pain

## 2015-02-14 NOTE — ED Provider Notes (Signed)
CSN: [id number]     Arrival date & time 02/14/15  1111 History  This chart was scribed for non-physician practitioner, Jinny Sanders, PA-C working with Nelva Nay, MD by Gwenyth Ober, ED scribe. This patient was seen in room TR07C/TR07C and the patient's care was started at 1:00 PM   Chief Complaint  Patient presents with  . Skin Problem   The history is provided by the patient. No language interpreter was used.    HPI Comments: Thomas Cameron is a 24 y.o. male who presents to the Emergency Department complaining of a gradually worsening area of swelling on his left cheek, with associated mild pain, that started 1 week ago and became worse 2 days ago. Pt states decreased ROM of his left jaw as an associated symptom. He denies cutting himself while shaving or recent injuries to the area. Pt also denies dental pain, nausea, vomitting and fever as associated symptoms.  No PCP  History reviewed. No pertinent past medical history. History reviewed. No pertinent past surgical history. History reviewed. No pertinent family history. History  Substance Use Topics  . Smoking status: Current Every Day Smoker    Types: Cigars  . Smokeless tobacco: Not on file  . Alcohol Use: Yes     Comment: occasional   Review of Systems  Constitutional: Negative for fever.  HENT: Negative for dental problem.   Gastrointestinal: Negative for nausea and vomiting.  Skin: Negative for wound.   Allergies  Review of patient's allergies indicates no known allergies.  Home Medications   Prior to Admission medications   Medication Sig Start Date End Date Taking? Authorizing Provider  clindamycin (CLEOCIN) 150 MG capsule Take 1 capsule (150 mg total) by mouth every 6 (six) hours. 02/24/14   Junius Finner, PA-C  HYDROcodone-acetaminophen (NORCO/VICODIN) 5-325 MG per tablet Take 1-2 tablets by mouth every 6 (six) hours as needed. 02/14/15   Ladona Mow, PA-C  ibuprofen (ADVIL,MOTRIN) 800 MG tablet Take 1 tablet (800  mg total) by mouth 3 (three) times daily. 02/14/15   Ladona Mow, PA-C  penicillin v potassium (VEETID) 500 MG tablet Take 1 tablet (500 mg total) by mouth 4 (four) times daily. 11/19/14   Roxy Horseman, PA-C  sulfamethoxazole-trimethoprim (BACTRIM DS,SEPTRA DS) 800-160 MG per tablet Take 1 tablet by mouth 2 (two) times daily. 02/14/15   Ladona Mow, PA-C   BP 130/98 mmHg  Pulse 64  Temp(Src) 98.4 F (36.9 C) (Oral)  Resp 16  Ht  (1.753 m)  Wt 152 lb (68.947 kg)  BMI 22.44 kg/m2  SpO2 100% Physical Exam  Constitutional: He appears well-developed and well-nourished. No distress.  HENT:  Head: Normocephalic and atraumatic.  Mouth/Throat: Uvula is midline, oropharynx is clear and moist and mucous membranes are normal. No oral lesions. No trismus in the jaw. Normal dentition. No dental abscesses, uvula swelling or dental caries. No oropharyngeal exudate, posterior oropharyngeal edema, posterior oropharyngeal erythema or tonsillar abscesses.  2 x 2 cm raised lesion on the left cheek. Anterior portion of cheek is soft, nonerythematous, nonedematous. Gumline's are intact. Floor of mouth is soft. Patient has full range of motion of jaw with no trismus. No dysphagia. Oropharynx is clear and moist without evidence of cellulitis or infection. No dental pain or dental caries.  Eyes: Conjunctivae and EOM are normal.  Neck: Neck supple. No tracheal deviation present.  Cardiovascular: Normal rate.   Pulmonary/Chest: Effort normal. No respiratory distress.  Skin: Skin is warm and dry.  Psychiatric: He has a normal  mood and affect. His behavior is normal.  Nursing note and vitals reviewed.   ED Course  Procedures   DIAGNOSTIC STUDIES: Oxygen Saturation is 97% on RA, normal by my interpretation.    COORDINATION OF CARE: 1:04 PM Discussed treatment plan with pt which includes US of the affected area. Pt agreed to plan.  1:33 PM Discussed suspicion for skin abscess or sebaceous cyst, per [place]. Will  plan on I&D and packing.   2:29 PM INCISION AND DRAINAGE PROCEDURE NOTE: Patient identification was confirmed and verbal consent was obtained. This procedure was performed by Jinny Sanders, PA-C at 2:29 PM Site: Left cheek Needle size: 25 gauge Anesthetic used (type and amt): 1% Lidocaine Blade size: 11 Drainage: Purulent and bloody; moderate amount Complexity: Complex Packing used.  Site anesthetized, incision made over site, wound drained and explored loculations, rinsed with copious amounts of normal saline, wound packed with sterile gauze, covered with dry, sterile dressing.  Pt tolerated procedure well without complications.  Instructions for care discussed verbally and pt provided with additional written instructions for homecare and f/u.  2:52 PM Pt feeling better after procedure. Advised pt to return with trouble swallowing, pain or swelling in his mouth, or worsening of symptoms. Pt to return in 2 days for re-check and packing removal.     Labs Review Labs Reviewed - No data to display  Imaging Review No results found.   EKG Interpretation None       MDM   Final diagnoses:  Abscess of external cheek, left    Patient with skin abscess amenable to incision and drainage.  Based on location of abscess, and no identifiable tenderness, swelling, trismus, dysphagia, induration or signs of cellulitis inside of the cheek or surrounding the lesion, there is no concern for parotitis. No concern for cellulitis, Ludwig's or spread of infection and to surrounding tissues. Abscess was categorized with ultrasound, showing 1.5 cm deep fluid collection.  Abscess incised, drained and packed,  wound recheck in 2 days. Encouraged home warm soaks and flushing.  Mild signs of cellulitis is surrounding skin.  Will d/c to home on Bactrim. Return precautions discussed, patient verbalizes understanding and agreement of this plan.  I personally performed the services described in this  documentation, which was scribed in my presence. The recorded information has been reviewed and is accurate.  BP 130/98 mmHg  Pulse 64  Temp(Src) 98.4 F (36.9 C) (Oral)  Resp 16  Ht 5\' 9"  (1.753 m)  Wt 152 lb (68.947 kg)  BMI 22.44 kg/m2  SpO2 100%  Signed,  Ladona Mow, PA-C 5:15 PM   Ladona Mow, PA-C 02/14/15 1715  Ladona Mow, PA-C 02/14/15 1727  Nelva Nay, MD 02/15/15 [phone]

## 2019-12-01 ENCOUNTER — Encounter (HOSPITAL_BASED_OUTPATIENT_CLINIC_OR_DEPARTMENT_OTHER): Payer: Self-pay

## 2019-12-01 ENCOUNTER — Emergency Department (HOSPITAL_BASED_OUTPATIENT_CLINIC_OR_DEPARTMENT_OTHER)
Admission: EM | Admit: 2019-12-01 | Discharge: 2019-12-01 | Disposition: A | Discharge status: Home / Self Care | Payer: Federal, State, Local not specified - PPO | Attending: Emergency Medicine | Admitting: Emergency Medicine

## 2019-12-01 ENCOUNTER — Other Ambulatory Visit: Payer: Self-pay

## 2019-12-01 DIAGNOSIS — J36 Peritonsillar abscess: Secondary | ICD-10-CM

## 2019-12-01 DIAGNOSIS — J029 Acute pharyngitis, unspecified: Secondary | ICD-10-CM | POA: Insufficient documentation

## 2019-12-01 DIAGNOSIS — F17200 Nicotine dependence, unspecified, uncomplicated: Secondary | ICD-10-CM | POA: Insufficient documentation

## 2019-12-01 MED ORDER — DEXAMETHASONE 10 MG/ML FOR PEDIATRIC ORAL USE
16.0000 mg | Freq: Once | INTRAMUSCULAR | Status: AC
  Administered 2019-12-01: 16 mg via ORAL
  Filled 2019-12-01: qty 2

## 2019-12-01 MED ORDER — LIDOCAINE VISCOUS HCL 2 % MT SOLN: 15.0000 mL | OROMUCOSAL | 0 refills | Status: DC | PRN

## 2019-12-01 MED ORDER — CLINDAMYCIN HCL 300 MG PO CAPS: 300.0000 mg | ORAL_CAPSULE | Freq: Four times a day (QID) | ORAL | 0 refills | Status: AC

## 2019-12-01 MED ORDER — KETOROLAC TROMETHAMINE 30 MG/ML IJ SOLN
30.0000 mg | Freq: Once | INTRAMUSCULAR | Status: AC
  Administered 2019-12-01: 30 mg via INTRAMUSCULAR
  Filled 2019-12-01: qty 1

## 2019-12-01 MED ORDER — CLINDAMYCIN HCL 150 MG PO CAPS
300.0000 mg | ORAL_CAPSULE | Freq: Once | ORAL | Status: AC
  Administered 2019-12-01: 300 mg via ORAL
  Filled 2019-12-01: qty 2

## 2019-12-01 NOTE — Discharge Instructions (Addendum)
Take the antibiotics as prescribed.  Follow up with the ENT doctors

## 2019-12-01 NOTE — ED Provider Notes (Signed)
[facility] EMERGENCY DEPARTMENT Provider Note   CSN: [id number] Arrival date & time: 12/01/19  1506    History Chief Complaint  Patient presents with  . Sore Throat   Thomas Cameron is a 29 y.o. male with no significant past medical history who presents for evaluation of sore throat.  Patient with sore throat x4 days.  Worse to the left side of his throat.  Has been able to tolerate small sips of liquids.  No drooling, dysphagia or trismus.  No voice changes.  Not taking anything for his pain.  Rates his pain an 8/10.  No prior history of peritonsillar abscesses.  No gesturing, rhinorrhea, voice changes, neck pain, neck stiffness, cough, fever, chills, nausea, vomiting, chest pain, shortness of breath.  Denies additional aggravating or relieving factors.  No recent Covid exposures that he knows of.  No sick contacts.  History obtained from patient and past medical records.  No interpreter is used.  HPI     History reviewed. No pertinent past medical history.  There are no problems to display for this patient.   History reviewed. No pertinent surgical history.     History reviewed. No pertinent family history.  Social History   Tobacco Use  . Smoking status: Current Every Day Smoker    Types: Cigars  . Smokeless tobacco: Never Used  Substance Use Topics  . Alcohol use: Yes    Comment: occasional  . Drug use: No    Home Medications Prior to Admission medications   Medication Sig Start Date End Date Taking? Authorizing Provider  clindamycin (CLEOCIN) 300 MG capsule Take 1 capsule (300 mg total) by mouth every 6 (six) hours for 7 days. 12/01/19 12/08/19  Mayla Biddy A, PA-C  HYDROcodone-acetaminophen (NORCO/VICODIN) 5-325 MG per tablet Take 1-2 tablets by mouth every 6 (six) hours as needed. 02/14/15   Ladona Mow, PA-C  ibuprofen (ADVIL,MOTRIN) 800 MG tablet Take 1 tablet (800 mg total) by mouth 3 (three) times daily. 02/14/15   Ladona Mow, PA-C  lidocaine  (XYLOCAINE) 2 % solution Use as directed 15 mLs in the mouth or throat as needed for mouth pain. 12/01/19   Chrisanna Mishra A, PA-C  penicillin v potassium (VEETID) 500 MG tablet Take 1 tablet (500 mg total) by mouth 4 (four) times daily. 11/19/14   Roxy Horseman, PA-C  sulfamethoxazole-trimethoprim (BACTRIM DS,SEPTRA DS) 800-160 MG per tablet Take 1 tablet by mouth 2 (two) times daily. 02/14/15   Ladona Mow, PA-C    Allergies    Patient has no known allergies.  Review of Systems   Review of Systems  Constitutional: Negative.   HENT: Positive for sore throat. Negative for congestion, dental problem, ear discharge, facial swelling, nosebleeds, postnasal drip, rhinorrhea, sinus pressure, sinus pain, sneezing, trouble swallowing and voice change.   Respiratory: Negative.   Cardiovascular: Negative.   Gastrointestinal: Negative.   Genitourinary: Negative.   Musculoskeletal: Negative.   Skin: Negative.   Neurological: Negative.   All other systems reviewed and are negative.  Physical Exam Updated Vital Signs BP (!) 152/90 (BP Location: Right Arm)   Pulse 98   Temp 98.8 F (37.1 C) (Oral)   Resp 16   Ht 5\' 9"  (1.753 m)   Wt 72.6 kg   SpO2 100%   BMI 23.63 kg/m   Physical Exam Vitals and nursing note reviewed.  Constitutional:      General: He is not in acute distress.    Appearance: He is well-developed. He is not  ill-appearing, toxic-appearing or diaphoretic.  HENT:     Head: Normocephalic and atraumatic.     Jaw: There is normal jaw occlusion.     Mouth/Throat:     Lips: Pink.     Mouth: Mucous membranes are moist.     Tongue: No lesions. Tongue does not deviate from midline.     Palate: No lesions.     Pharynx: Posterior oropharyngeal erythema present.     Tonsils: Tonsillar abscess present. No tonsillar exudate.      Comments: Peritonsillar abscess left with uvular deviation right.  Erythema without exudate.  Eyes:     Pupils: Pupils are equal, round, and reactive to  light.  Neck:     Trachea: Trachea and phonation normal.     Comments: No neck stiffness or neck rigidity. Cardiovascular:     Rate and Rhythm: Normal rate and regular rhythm.     Pulses: Normal pulses.          Radial pulses are 2+ on the right side and 2+ on the left side.     Heart sounds: Normal heart sounds.  Pulmonary:     Effort: Pulmonary effort is normal. No respiratory distress.     Breath sounds: Normal breath sounds and air entry.  Abdominal:     General: Bowel sounds are normal. There is no distension.     Palpations: Abdomen is soft.     Tenderness: There is no abdominal tenderness.  Musculoskeletal:        General: Normal range of motion.     Cervical back: Full passive range of motion without pain, normal range of motion and neck supple.  Skin:    General: Skin is warm and dry.     Capillary Refill: Capillary refill takes less than 2 seconds.     Comments: Brisk cap refill  Neurological:     Mental Status: He is alert.     Cranial Nerves: Cranial nerves are intact.     Sensory: Sensation is intact.     Motor: Motor function is intact.    ED Results / Procedures / Treatments   Labs (all labs ordered are listed, but only abnormal results are displayed) Labs Reviewed - No data to display  EKG None  Radiology No results found.  Procedures Procedures (including critical care time)  Medications Ordered in ED Medications  ketorolac (TORADOL) 30 MG/ML injection 30 mg (30 mg Intramuscular Given 12/01/19 1545)  dexamethasone (DECADRON) 10 MG/ML injection for Pediatric ORAL use 16 mg (16 mg Oral Given 12/01/19 1548)  clindamycin (CLEOCIN) capsule 300 mg (300 mg Oral Given 12/01/19 1549)    ED Course  I have reviewed the triage vital signs and the nursing notes.  Pertinent labs & imaging results that were available during my care of the patient were reviewed by me and considered in my medical decision making (see chart for details).  29 year old male presents  for evaluation of sore throat.  He is afebrile, nonseptic, non-ill-appearing.  No drooling, dysphagia or trismus.  Patient with likly peritonsillar abscess to left however is handling his secretions.  No voice changes. No neck stiffness or neck rigidity.  No cough or upper respiratory symptoms low suspicion for Covid.  Discussed with patient labs, CT scan and IV medications.  Patient does not want this at this time.  I have discussed risk versus benefit.  Voices understading however declines IV, CT scan. Given he appear well and is tolerating PO intake, We will do p.o. trial  with oral antibiotics, IM Toradol, Decadron and reassess.  Patient reassessed. Tolerated PO intake without difficulty. Fransico Him has PTA on exam however is protecting his airway.  Does not appear septic.  Will plan to DC home with symptomatic management, antibiotics and close follow-up with ENT.  He will return if he has any new or worsening symptoms.  The patient has been appropriately medically screened and/or stabilized in the ED. I have low suspicion for any other emergent medical condition which would require further screening, evaluation or treatment in the ED or require inpatient management.  Patient is hemodynamically stable and in no acute distress.  Patient able to ambulate in department prior to ED.  Evaluation does not show acute pathology that would require ongoing or additional emergent interventions while in the emergency department or further inpatient treatment.  I have discussed the diagnosis with the patient and answered all questions.  Pain is been managed while in the emergency department and patient has no further complaints prior to discharge.  Patient is comfortable with plan discussed in room and is stable for discharge at this time.  I have discussed strict return precautions for returning to the emergency department.  Patient was encouraged to follow-up with PCP/specialist refer to at discharge.    MDM  Rules/Calculators/A&P                       Final Clinical Impression(s) / ED Diagnoses Final diagnoses:  Sore throat  Peritonsillar abscess determined by examination    Rx / DC Orders ED Discharge Orders         Ordered    lidocaine (XYLOCAINE) 2 % solution  As needed     12/01/19 1607    clindamycin (CLEOCIN) 300 MG capsule  Every 6 hours     12/01/19 1607           Janeka Libman A, PA-C 12/01/19 1625    Dorie Rank, MD 12/04/19 [phone]

## 2019-12-01 NOTE — ED Triage Notes (Signed)
Sore throat since Sunday, increasingly worse since last night. C/o headache, has taken numerous cold/flu meds with no relief. States some difficulty swallowing but no difficulty breathing. 98.8 & 100% in triage.

## 2021-07-18 ENCOUNTER — Other Ambulatory Visit: Payer: Self-pay

## 2021-07-18 ENCOUNTER — Encounter (HOSPITAL_BASED_OUTPATIENT_CLINIC_OR_DEPARTMENT_OTHER): Payer: Self-pay | Admitting: Emergency Medicine

## 2021-07-18 ENCOUNTER — Emergency Department (HOSPITAL_BASED_OUTPATIENT_CLINIC_OR_DEPARTMENT_OTHER)
Admission: EM | Admit: 2021-07-18 | Discharge: 2021-07-18 | Disposition: A | Discharge status: Home / Self Care | Payer: Self-pay | Attending: Emergency Medicine | Admitting: Emergency Medicine

## 2021-07-18 DIAGNOSIS — Z5321 Procedure and treatment not carried out due to patient leaving prior to being seen by health care provider: Secondary | ICD-10-CM | POA: Insufficient documentation

## 2021-07-18 DIAGNOSIS — Z20822 Contact with and (suspected) exposure to covid-19: Secondary | ICD-10-CM | POA: Insufficient documentation

## 2021-07-18 DIAGNOSIS — H9202 Otalgia, left ear: Secondary | ICD-10-CM | POA: Insufficient documentation

## 2021-07-18 DIAGNOSIS — J029 Acute pharyngitis, unspecified: Secondary | ICD-10-CM | POA: Insufficient documentation

## 2021-07-18 LAB — RESP PANEL BY RT-PCR (FLU A&B, COVID) ARPGX2
Influenza A by PCR: NEGATIVE
Influenza B by PCR: NEGATIVE
SARS Coronavirus 2 by RT PCR: NEGATIVE

## 2021-07-18 LAB — GROUP A STREP BY PCR: Group A Strep by PCR: NOT DETECTED

## 2021-07-18 NOTE — ED Notes (Signed)
Per registration, patient left at this time.

## 2021-07-18 NOTE — ED Triage Notes (Signed)
Sore throat and L ear pain x 2 days. Hoarse voice noted.

## 2021-07-19 ENCOUNTER — Emergency Department (HOSPITAL_COMMUNITY): Payer: Self-pay

## 2021-07-19 ENCOUNTER — Other Ambulatory Visit: Payer: Self-pay

## 2021-07-19 ENCOUNTER — Emergency Department (HOSPITAL_COMMUNITY)
Admission: EM | Admit: 2021-07-19 | Discharge: 2021-07-19 | Disposition: A | Discharge status: Home / Self Care | Payer: Self-pay | Attending: Emergency Medicine | Admitting: Emergency Medicine

## 2021-07-19 ENCOUNTER — Encounter (HOSPITAL_COMMUNITY): Payer: Self-pay | Admitting: Emergency Medicine

## 2021-07-19 DIAGNOSIS — F17210 Nicotine dependence, cigarettes, uncomplicated: Secondary | ICD-10-CM | POA: Insufficient documentation

## 2021-07-19 DIAGNOSIS — J039 Acute tonsillitis, unspecified: Secondary | ICD-10-CM

## 2021-07-19 DIAGNOSIS — Z79899 Other long term (current) drug therapy: Secondary | ICD-10-CM | POA: Insufficient documentation

## 2021-07-19 DIAGNOSIS — Z20822 Contact with and (suspected) exposure to covid-19: Secondary | ICD-10-CM | POA: Insufficient documentation

## 2021-07-19 LAB — BASIC METABOLIC PANEL
Anion gap: 12 (ref 5–15)
BUN: 8 mg/dL (ref 6–20)
CO2: 22 mmol/L (ref 22–32)
Calcium: 9.3 mg/dL (ref 8.9–10.3)
Chloride: 98 mmol/L (ref 98–111)
Creatinine, Ser: 0.71 mg/dL (ref 0.61–1.24)
GFR, Estimated: >60 mL/min (ref >60)
Glucose, Bld: 116 mg/dL — ABNORMAL HIGH (ref 70–99)
Potassium: 3.1 mmol/L — ABNORMAL LOW (ref 3.5–5.1)
Sodium: 132 mmol/L — ABNORMAL LOW (ref 135–145)

## 2021-07-19 LAB — CBC WITH DIFFERENTIAL/PLATELET
Abs Immature Granulocytes: 0.08 10*3/uL — ABNORMAL HIGH (ref 0.00–0.07)
Basophils Absolute: 0.1 10*3/uL (ref 0.0–0.1)
Basophils Relative: 0 %
Eosinophils Absolute: 0 10*3/uL (ref 0.0–0.5)
Eosinophils Relative: 0 %
HCT: 44.6 % (ref 39.0–52.0)
Hemoglobin: 15.9 g/dL (ref 13.0–17.0)
Immature Granulocytes: 1 %
Lymphocytes Relative: 14 %
Lymphs Abs: 2.4 10*3/uL (ref 0.7–4.0)
MCH: 34.4 pg — ABNORMAL HIGH (ref 26.0–34.0)
MCHC: 35.7 g/dL (ref 30.0–36.0)
MCV: 96.5 fL (ref 80.0–100.0)
Monocytes Absolute: 2.9 10*3/uL — ABNORMAL HIGH (ref 0.1–1.0)
Monocytes Relative: 17 %
Neutro Abs: 11.8 10*3/uL — ABNORMAL HIGH (ref 1.7–7.7)
Neutrophils Relative %: 68 %
Platelets: 223 10*3/uL (ref 150–400)
RBC: 4.62 MIL/uL (ref 4.22–5.81)
RDW: 12.5 % (ref 11.5–15.5)
WBC: 17.3 10*3/uL — ABNORMAL HIGH (ref 4.0–10.5)
nRBC: 0 % (ref 0.0–0.2)

## 2021-07-19 LAB — GROUP A STREP BY PCR: Group A Strep by PCR: NOT DETECTED

## 2021-07-19 LAB — RESP PANEL BY RT-PCR (FLU A&B, COVID) ARPGX2
Influenza A by PCR: NEGATIVE
Influenza B by PCR: NEGATIVE
SARS Coronavirus 2 by RT PCR: NEGATIVE

## 2021-07-19 MED ORDER — CLINDAMYCIN PHOSPHATE 600 MG/50ML IV SOLN
600.0000 mg | Freq: Once | INTRAVENOUS | Status: AC
  Administered 2021-07-19: 600 mg via INTRAVENOUS
  Filled 2021-07-19: qty 50

## 2021-07-19 MED ORDER — DEXAMETHASONE SODIUM PHOSPHATE 10 MG/ML IJ SOLN
10.0000 mg | Freq: Once | INTRAMUSCULAR | Status: AC
  Administered 2021-07-19: 10 mg via INTRAVENOUS
  Filled 2021-07-19: qty 1

## 2021-07-19 MED ORDER — CLINDAMYCIN HCL 300 MG PO CAPS: 300.0000 mg | ORAL_CAPSULE | Freq: Four times a day (QID) | ORAL | 0 refills | Status: AC

## 2021-07-19 MED ORDER — ACETAMINOPHEN 500 MG PO TABS
1000.0000 mg | ORAL_TABLET | Freq: Once | ORAL | Status: AC
  Administered 2021-07-19: 1000 mg via ORAL
  Filled 2021-07-19: qty 2

## 2021-07-19 MED ORDER — IBUPROFEN 600 MG PO TABS: 600.0000 mg | ORAL_TABLET | Freq: Four times a day (QID) | ORAL | 0 refills | Status: AC | PRN

## 2021-07-19 MED ORDER — IOHEXOL 350 MG/ML SOLN
80.0000 mL | Freq: Once | INTRAVENOUS | Status: AC | PRN
  Administered 2021-07-19: 80 mL via INTRAVENOUS

## 2021-07-19 MED ORDER — ONDANSETRON HCL 4 MG/2ML IJ SOLN
4.0000 mg | Freq: Once | INTRAMUSCULAR | Status: AC
  Administered 2021-07-19: 4 mg via INTRAVENOUS
  Filled 2021-07-19: qty 2

## 2021-07-19 NOTE — Discharge Instructions (Signed)
You have been evaluated for your symptoms.  CT scan shows that you have tonsillitis which is infections of your tonsil.  Please take antibiotic as prescribed, you may follow-up with ear nose and throat specialist as needed for further care.  Return if you have any concern.

## 2021-07-19 NOTE — ED Triage Notes (Signed)
Pt reports abscess in throat that burst 2 days ago. Pt reports coughing up blood and swollen tonsils for 2days. Temp in triage 100.4

## 2021-07-19 NOTE — ED Provider Notes (Signed)
[facility]-EMERGENCY DEPT Provider Note   CSN: [id number] Arrival date & time: 07/19/21  1157     History No chief complaint on file.   Thomas Cameron is a 30 y.o. male.  The history is provided by the patient. No language interpreter was used.   30 year old male presenting complaining of sore throat.  For the past 4 days patient has progressive worsening pain to the left side of his throat radiates towards his neck, difficulty swallowing, having fever chills body aches and overall not feeling well.  Symptom is becoming progressively worse.  Today he also felt something ruptured in his mouth and he has been spitting up purulent emesis.  He does not complain of congestion or cough no chest pain or shortness of breath no abdominal pain.  He did try taking some over-the-counter medication without adequate relief.  Symptoms moderate to severe.  No headache  No past medical history on file.  There are no problems to display for this patient.   No past surgical history on file.     No family history on file.  Social History   Tobacco Use   Smoking status: Every Day    Types: Cigars   Smokeless tobacco: Never  Substance Use Topics   Alcohol use: Yes    Comment: occasional   Drug use: No    Home Medications Prior to Admission medications   Medication Sig Start Date End Date Taking? Authorizing Provider  HYDROcodone-acetaminophen (NORCO/VICODIN) 5-325 MG per tablet Take 1-2 tablets by mouth every 6 (six) hours as needed. 02/14/15   Ladona Mow, PA-C  ibuprofen (ADVIL,MOTRIN) 800 MG tablet Take 1 tablet (800 mg total) by mouth 3 (three) times daily. 02/14/15   Ladona Mow, PA-C  lidocaine (XYLOCAINE) 2 % solution Use as directed 15 mLs in the mouth or throat as needed for mouth pain. 12/01/19   Henderly, Britni A, PA-C  penicillin v potassium (VEETID) 500 MG tablet Take 1 tablet (500 mg total) by mouth 4 (four) times daily. 11/19/14   Roxy Horseman, PA-C   sulfamethoxazole-trimethoprim (BACTRIM DS,SEPTRA DS) 800-160 MG per tablet Take 1 tablet by mouth 2 (two) times daily. 02/14/15   Ladona Mow, PA-C    Allergies    Patient has no known allergies.  Review of Systems   Review of Systems  All other systems reviewed and are negative.  Physical Exam Updated Vital Signs BP (!) 132/91 (BP Location: Left Arm)    Pulse 93    Temp (!) 100.4 F (38 C) (Oral)    Resp 18    SpO2 98%   Physical Exam Vitals and nursing note reviewed.  Constitutional:      General: He is not in acute distress.    Appearance: He is well-developed.  HENT:     Head: Atraumatic.     Mouth/Throat:     Comments: Throat: Uvula is deviated to the right, fullness and erythema noted to left tonsillar pillar with bilateral tonsillar enlargement.  No trismus. Eyes:     Conjunctiva/sclera: Conjunctivae normal.  Cardiovascular:     Rate and Rhythm: Normal rate and regular rhythm.     Pulses: Normal pulses.     Heart sounds: Normal heart sounds.  Pulmonary:     Effort: Pulmonary effort is normal.     Breath sounds: Normal breath sounds.  Abdominal:     Palpations: Abdomen is soft.     Tenderness: There is no abdominal tenderness.  Musculoskeletal:  Cervical back: Neck supple. No rigidity.  Lymphadenopathy:     Cervical: Cervical adenopathy present.  Skin:    Findings: No rash.  Neurological:     Mental Status: He is alert. Mental status is at baseline.    ED Results / Procedures / Treatments   Labs (all labs ordered are listed, but only abnormal results are displayed) Labs Reviewed  BASIC METABOLIC PANEL - Abnormal; Notable for the following components:      Result Value   Sodium 132 (*)    Potassium 3.1 (*)    Glucose, Bld 116 (*)    All other components within normal limits  CBC WITH DIFFERENTIAL/PLATELET - Abnormal; Notable for the following components:   WBC 17.3 (*)    MCH 34.4 (*)    Neutro Abs 11.8 (*)    Monocytes Absolute 2.9 (*)    Abs  Immature Granulocytes 0.08 (*)    All other components within normal limits  GROUP A STREP BY PCR  RESP PANEL BY RT-PCR (FLU A&B, COVID) [id number]    EKG None  Radiology CT Soft Tissue Neck W Contrast  Result Date: 07/19/2021 CLINICAL DATA:  Epiglottitis or tonsillitis suspected. Left peritonsillar abscess. EXAM: CT NECK WITH CONTRAST TECHNIQUE: Multidetector CT imaging of the neck was performed using the standard protocol following the bolus administration of intravenous contrast. CONTRAST:  43mL OMNIPAQUE IOHEXOL 350 MG/ML SOLN COMPARISON:  None. FINDINGS: Pharynx and larynx: Mild fullness is present in the adenoid tissue. No discrete lesion is present. Asymmetric enlargement of the left palatine tonsil noted. No discrete fluid collection is present. Straightening of the left parapharyngeal flat noted. Tongue base and right oropharynx are within normal limits. The airway is patent. Vallecula and epiglottis are within normal limits. Aryepiglottic folds and piriform sinuses are clear. Vocal cords are midline and symmetric. Trachea is clear. Salivary glands: The submandibular and parotid glands and ducts are within normal limits. Thyroid: Normal. Lymph nodes: Enlarged left submandibular, level 2 and level 3 lymph nodes are reactive. No necrotic nodes are present. Vascular: Normal. Limited intracranial: Within normal limits. Visualized orbits: The globes and orbits are within normal limits. Mastoids and visualized paranasal sinuses: The paranasal sinuses and mastoid air cells are clear. Skeleton: No focal osseous lesions. Vertebral body heights and alignment are normal. Upper chest: The lung apices are clear. Thoracic inlet is within normal limits. IMPRESSION: 1. Asymmetric enlargement of the left palatine tonsil compatible with acute tonsillitis. 2. No discrete fluid collection to suggest abscess. 3. Reactive left submandibular, level 2 and level 3 lymph nodes. Electronically Signed   By: Marin Roberts M.D.   On: 07/19/2021 15:58    Procedures Procedures   Medications Ordered in ED Medications  clindamycin (CLEOCIN) IVPB 600 mg (0 mg Intravenous Stopped 07/19/21 1333)  dexamethasone (DECADRON) injection 10 mg (10 mg Intravenous Given 07/19/21 1245)  acetaminophen (TYLENOL) tablet 1,000 mg (1,000 mg Oral Given 07/19/21 1245)  ondansetron (ZOFRAN) injection 4 mg (4 mg Intravenous Given 07/19/21 1332)  iohexol (OMNIPAQUE) 350 MG/ML injection 80 mL (80 mLs Intravenous Contrast Given 07/19/21 1409)    ED Course  I have reviewed the triage vital signs and the nursing notes.  Pertinent labs & imaging results that were available during my care of the patient were reviewed by me and considered in my medical decision making (see chart for details).    MDM Rules/Calculators/A&P  BP 133/65 (BP Location: Left Arm)    Pulse 62    Temp (!) 100.4 F (38 C) (Oral)    Resp 18    SpO2 100%      Final Clinical Impression(s) / ED Diagnoses Final diagnoses:  Tonsillitis    Rx / DC Orders ED Discharge Orders          Ordered    clindamycin (CLEOCIN) 300 MG capsule  4 times daily        07/19/21 1607    ibuprofen (ADVIL) 600 MG tablet  Every 6 hours PRN        07/19/21 1607           12:21 PM Patient here with progressive worsening sore throat, fever chills and trouble swallowing.  On exam he has significant erythema edema noted to left tonsillar pillar suggestive of left peritonsillar abscess.  He is actively spitting into a cup.  He has muffled voice.  Will obtain neck CT scan to assess for the severity of this suspected peritonsillar abscess.  Will give clindamycin and Decadron, strep test ordered, will check basic labs.  4:05 PM Labs remarkable for elevated white count of 17.3, mild hypokalemia with potassium of 3.1, will give supplementation, negative viral respiratory panel, negative group A strep, CT scan of the soft tissue neck shows asymmetric  enlargement of the left palatine tonsil compatible with acute Tonsillitis without any obvious of abscess.  Patient has reactive lymph nodes.  Plan to discharge patient home with antibiotic.  Will give referral to ENT for further care   Fayrene Helper, PA-C 07/19/21 1609    Lorre Nick, MD 07/28/21 1022

## 2021-07-24 ENCOUNTER — Other Ambulatory Visit: Payer: Self-pay

## 2021-07-24 ENCOUNTER — Emergency Department (HOSPITAL_BASED_OUTPATIENT_CLINIC_OR_DEPARTMENT_OTHER)
Admission: EM | Admit: 2021-07-24 | Discharge: 2021-07-24 | Disposition: A | Discharge status: Home / Self Care | Payer: Self-pay | Attending: Emergency Medicine | Admitting: Emergency Medicine

## 2021-07-24 ENCOUNTER — Emergency Department (HOSPITAL_COMMUNITY)
Admission: EM | Admit: 2021-07-24 | Discharge: 2021-07-24 | Disposition: A | Discharge status: Home / Self Care | Payer: Self-pay | Attending: Emergency Medicine | Admitting: Emergency Medicine

## 2021-07-24 ENCOUNTER — Encounter (HOSPITAL_COMMUNITY): Payer: Self-pay

## 2021-07-24 ENCOUNTER — Encounter (HOSPITAL_BASED_OUTPATIENT_CLINIC_OR_DEPARTMENT_OTHER): Payer: Self-pay | Admitting: Emergency Medicine

## 2021-07-24 DIAGNOSIS — J029 Acute pharyngitis, unspecified: Secondary | ICD-10-CM | POA: Insufficient documentation

## 2021-07-24 DIAGNOSIS — Z20822 Contact with and (suspected) exposure to covid-19: Secondary | ICD-10-CM | POA: Insufficient documentation

## 2021-07-24 DIAGNOSIS — D649 Anemia, unspecified: Secondary | ICD-10-CM

## 2021-07-24 DIAGNOSIS — D508 Other iron deficiency anemias: Secondary | ICD-10-CM | POA: Insufficient documentation

## 2021-07-24 DIAGNOSIS — E876 Hypokalemia: Secondary | ICD-10-CM | POA: Insufficient documentation

## 2021-07-24 DIAGNOSIS — Z5321 Procedure and treatment not carried out due to patient leaving prior to being seen by health care provider: Secondary | ICD-10-CM | POA: Insufficient documentation

## 2021-07-24 DIAGNOSIS — E871 Hypo-osmolality and hyponatremia: Secondary | ICD-10-CM | POA: Insufficient documentation

## 2021-07-24 LAB — CBC WITH DIFFERENTIAL/PLATELET
Abs Immature Granulocytes: 0.1 10*3/uL — ABNORMAL HIGH (ref 0.00–0.07)
Basophils Absolute: 0.1 10*3/uL (ref 0.0–0.1)
Basophils Relative: 0 %
Eosinophils Absolute: 0 10*3/uL (ref 0.0–0.5)
Eosinophils Relative: 0 %
HCT: 35.7 % — ABNORMAL LOW (ref 39.0–52.0)
Hemoglobin: 12.4 g/dL — ABNORMAL LOW (ref 13.0–17.0)
Immature Granulocytes: 1 %
Lymphocytes Relative: 12 %
Lymphs Abs: 1.5 10*3/uL (ref 0.7–4.0)
MCH: 33.1 pg (ref 26.0–34.0)
MCHC: 34.7 g/dL (ref 30.0–36.0)
MCV: 95.2 fL (ref 80.0–100.0)
Monocytes Absolute: 1.6 10*3/uL — ABNORMAL HIGH (ref 0.1–1.0)
Monocytes Relative: 13 %
Neutro Abs: 8.9 10*3/uL — ABNORMAL HIGH (ref 1.7–7.7)
Neutrophils Relative %: 74 %
Platelets: 298 10*3/uL (ref 150–400)
RBC: 3.75 MIL/uL — ABNORMAL LOW (ref 4.22–5.81)
RDW: 12.3 % (ref 11.5–15.5)
WBC: 12.1 10*3/uL — ABNORMAL HIGH (ref 4.0–10.5)
nRBC: 0 % (ref 0.0–0.2)

## 2021-07-24 LAB — COMPREHENSIVE METABOLIC PANEL
ALT: 17 U/L (ref 0–44)
AST: 14 U/L — ABNORMAL LOW (ref 15–41)
Albumin: 3.6 g/dL (ref 3.5–5.0)
Alkaline Phosphatase: 63 U/L (ref 38–126)
Anion gap: 9 (ref 5–15)
BUN: 7 mg/dL (ref 6–20)
CO2: 24 mmol/L (ref 22–32)
Calcium: 8.5 mg/dL — ABNORMAL LOW (ref 8.9–10.3)
Chloride: 99 mmol/L (ref 98–111)
Creatinine, Ser: 0.72 mg/dL (ref 0.61–1.24)
GFR, Estimated: >60 mL/min (ref >60)
Glucose, Bld: 137 mg/dL — ABNORMAL HIGH (ref 70–99)
Potassium: 3.2 mmol/L — ABNORMAL LOW (ref 3.5–5.1)
Sodium: 132 mmol/L — ABNORMAL LOW (ref 135–145)
Total Bilirubin: 0.3 mg/dL (ref 0.3–1.2)
Total Protein: 7 g/dL (ref 6.5–8.1)

## 2021-07-24 LAB — MONONUCLEOSIS SCREEN: Mono Screen: NEGATIVE

## 2021-07-24 LAB — GROUP A STREP BY PCR: Group A Strep by PCR: NOT DETECTED

## 2021-07-24 LAB — RESP PANEL BY RT-PCR (FLU A&B, COVID) ARPGX2
Influenza A by PCR: NEGATIVE
Influenza B by PCR: NEGATIVE
SARS Coronavirus 2 by RT PCR: NEGATIVE

## 2021-07-24 MED ORDER — POTASSIUM CHLORIDE CRYS ER 20 MEQ PO TBCR: 20.0000 meq | EXTENDED_RELEASE_TABLET | Freq: Two times a day (BID) | ORAL | 0 refills | Status: AC

## 2021-07-24 MED ORDER — DEXAMETHASONE SODIUM PHOSPHATE 10 MG/ML IJ SOLN
10.0000 mg | Freq: Once | INTRAMUSCULAR | Status: AC
  Administered 2021-07-24: 10 mg via INTRAVENOUS
  Filled 2021-07-24: qty 1

## 2021-07-24 MED ORDER — POTASSIUM CHLORIDE CRYS ER 20 MEQ PO TBCR
40.0000 meq | EXTENDED_RELEASE_TABLET | Freq: Once | ORAL | Status: AC
  Administered 2021-07-24: 40 meq via ORAL
  Filled 2021-07-24: qty 2

## 2021-07-24 MED ORDER — LACTATED RINGERS IV BOLUS
1000.0000 mL | Freq: Once | INTRAVENOUS | Status: AC
  Administered 2021-07-24: 1000 mL via INTRAVENOUS

## 2021-07-24 MED ORDER — ACETAMINOPHEN 325 MG PO TABS
650.0000 mg | ORAL_TABLET | Freq: Once | ORAL | Status: AC
  Administered 2021-07-24: 650 mg via ORAL
  Filled 2021-07-24: qty 2

## 2021-07-24 NOTE — ED Triage Notes (Signed)
°  Patient comes in for sore throat and fever that has been going on for about a week.  Patient was seen on 07/19/21 and diagnosed with tonsillitis and given clindamycin.  Patient states he has been taking antibiotics but still having high fevers up to 103 at home.  Took home covid test that was negative.  Pain 4/10, tender/sore throat.

## 2021-07-24 NOTE — Discharge Instructions (Signed)
Drink plenty of fluids.  Take ibuprofen or acetaminophen as needed for fever and pain.  Return if you start having difficulty swallowing.

## 2021-07-24 NOTE — ED Notes (Signed)
Patient verbalizes understanding of discharge instructions. Opportunity for questioning and answers were provided. Patient discharged from ED.  °

## 2021-07-24 NOTE — ED Provider Notes (Signed)
[place] [facility] EMERGENCY DEPT Provider Note   CSN: [place]:[id number] Arrival date & time: 07/24/21  [id number]     History  Chief Complaint  Patient presents with   Fever   Sore Throat    Thomas Cameron is a 31 y.o. male.  The history is provided by the patient.  Fever Sore Throat He was seen in emergency 5 days ago and diagnosed with tonsillitis and discharged with prescription for clindamycin.  He has been taking the clindamycin and states that his throat has improved somewhat.  His voice had been muffled and now is back to normal.  He is not having any difficulty drinking or swallowing.  However, he continues to run low-grade fevers.  He takes acetaminophen and ibuprofen which give temporary relief of his temperature, but it temperature comes back up.  He continues to have sore throat although it is improved compared with what it was.  There has been slight rhinorrhea but no cough.  He denies vomiting or diarrhea.  He denies arthralgias or myalgias.   Home Medications Prior to Admission medications   Medication Sig Start Date End Date Taking? Authorizing Provider  clindamycin (CLEOCIN) 300 MG capsule Take 1 capsule (300 mg total) by mouth 4 (four) times daily. X 7 days 07/19/21   Domenic Moras, PA-C  HYDROcodone-acetaminophen (NORCO/VICODIN) 5-325 MG per tablet Take 1-2 tablets by mouth every 6 (six) hours as needed. 02/14/15   Dahlia Bailiff, PA-C  ibuprofen (ADVIL) 600 MG tablet Take 1 tablet (600 mg total) by mouth every 6 (six) hours as needed for moderate pain. 07/19/21   Domenic Moras, PA-C  lidocaine (XYLOCAINE) 2 % solution Use as directed 15 mLs in the mouth or throat as needed for mouth pain. 12/01/19   Henderly, Britni A, PA-C  penicillin v potassium (VEETID) 500 MG tablet Take 1 tablet (500 mg total) by mouth 4 (four) times daily. 11/19/14   Montine Circle, PA-C  sulfamethoxazole-trimethoprim (BACTRIM DS,SEPTRA DS) 800-160 MG per tablet Take 1 tablet by mouth 2 (two) times daily.  02/14/15   Dahlia Bailiff, PA-C      Allergies    Patient has no known allergies.    Review of Systems   Review of Systems  Constitutional:  Positive for fever.  All other systems reviewed and are negative.  Physical Exam Updated Vital Signs BP (!) 147/85 (BP Location: Right Arm)    Pulse 89    Temp 100.2 F (37.9 C) (Oral)    Resp 20    Ht 5\' 11"  (1.803 m)    Wt 72.6 kg    SpO2 99%    BMI 22.32 kg/m  Physical Exam Vitals and nursing note reviewed.  31 year old male, resting comfortably and in no acute distress. Vital signs are significant for elevated blood pressure and temperature that is top normal. Oxygen saturation is 99%, which is normal. Head is normocephalic and atraumatic. PERRLA, EOMI. Oropharynx shows mild tonsillar erythema and hypertrophy without exudate.  There is no pooling of secretions.  Phonation is normal. Neck is nontender and supple without adenopathy or JVD. Back is nontender and there is no CVA tenderness. Lungs are clear without rales, wheezes, or rhonchi. Chest is nontender. Heart has regular rate and rhythm without murmur. Abdomen is soft, flat, nontender. Extremities have no cyanosis or edema, full range of motion is present. Skin is warm and dry without rash. Neurologic: Mental status is normal, cranial nerves are intact, moves all extremities equally.  ED Results / Procedures /  Treatments   Labs (all labs ordered are listed, but only abnormal results are displayed) Labs Reviewed  COMPREHENSIVE METABOLIC PANEL - Abnormal; Notable for the following components:      Result Value   Sodium 132 (*)    Potassium 3.2 (*)    Glucose, Bld 137 (*)    Calcium 8.5 (*)    AST 14 (*)    All other components within normal limits  CBC WITH DIFFERENTIAL/PLATELET - Abnormal; Notable for the following components:   WBC 12.1 (*)    RBC 3.75 (*)    Hemoglobin 12.4 (*)    HCT 35.7 (*)    Neutro Abs 8.9 (*)    Monocytes Absolute 1.6 (*)    Abs Immature Granulocytes  0.10 (*)    All other components within normal limits  MONONUCLEOSIS SCREEN   Procedures Procedures    Medications Ordered in ED Medications  potassium chloride SA (KLOR-CON M) CR tablet 40 mEq (has no administration in time range)  lactated ringers bolus 1,000 mL (0 mLs Intravenous Stopped 07/24/21 0704)  acetaminophen (TYLENOL) tablet 650 mg (650 mg Oral Given 07/24/21 0553)  dexamethasone (DECADRON) injection 10 mg (10 mg Intravenous Given 07/24/21 0555)    ED Course/ Medical Decision Making/ A&P                           Medical Decision Making  Tonsillitis with persistent but improving symptoms.  Old records are reviewed showing ED visit on 12/31 where there was concern for peritonsillar abscess but CT showed no evidence of abscess.  Exam today seems improved compared with what is recorded from prior ED visit.  Will check screening labs and give IV fluids and IV dexamethasone.  No need to repeat strep PCR since if he did have strep, antibiotics should have cured it.  Also, he is now outside the treatment window for influenza and COVID-19, no need to repeat that test.  Mono screen is negative.  Metabolic panel was significant for mild hyponatremia which is not felt to be clinically significant, and mild hypokalemia.  He is given a dose of oral potassium and a prescription for potassium.  CBC shows mild anemia.  He is discharged with instructions to continue drinking fluids, return if he starts having difficulty swallowing.        Final Clinical Impression(s) / ED Diagnoses Final diagnoses:  Pharyngitis, unspecified etiology  Hypokalemia  Hyponatremia  Normochromic normocytic anemia    Rx / DC Orders ED Discharge Orders          Ordered    potassium chloride SA (KLOR-CON M) 20 MEQ tablet  2 times daily        07/24/21 [id number]              Delora Fuel, MD [id number] [phone]

## 2021-07-24 NOTE — ED Triage Notes (Signed)
Pt reports fever, chills, body aches, dry and sore throat onset a couple of days ago
